# Patient Record
Sex: Female | Born: 1950 | Race: Black or African American | Hispanic: No | Marital: Married | Smoking: Never smoker
Health system: Southern US, Community
[De-identification: ages and names within clinical notes are randomized; demographics above are authoritative.]

## PROBLEM LIST (undated history)

## (undated) DIAGNOSIS — I10 Essential (primary) hypertension: Secondary | ICD-10-CM

## (undated) DIAGNOSIS — M1990 Unspecified osteoarthritis, unspecified site: Secondary | ICD-10-CM

## (undated) DIAGNOSIS — R011 Cardiac murmur, unspecified: Secondary | ICD-10-CM

## (undated) DIAGNOSIS — N289 Disorder of kidney and ureter, unspecified: Secondary | ICD-10-CM

## (undated) DIAGNOSIS — I499 Cardiac arrhythmia, unspecified: Secondary | ICD-10-CM

## (undated) DIAGNOSIS — K769 Liver disease, unspecified: Secondary | ICD-10-CM

## (undated) HISTORY — PX: ABDOMINAL HYSTERECTOMY: SHX81

## (undated) HISTORY — PX: TUBAL LIGATION: SHX77

## (undated) HISTORY — PX: CHOLECYSTECTOMY: SHX55

---

## 2011-01-09 ENCOUNTER — Emergency Department (INDEPENDENT_AMBULATORY_CARE_PROVIDER_SITE_OTHER): Payer: Medicaid Other

## 2011-01-09 ENCOUNTER — Emergency Department (HOSPITAL_BASED_OUTPATIENT_CLINIC_OR_DEPARTMENT_OTHER)
Admission: EM | Admit: 2011-01-09 | Discharge: 2011-01-09 | Disposition: A | Discharge status: Nursing Home (Includes ICF) | Payer: Medicaid Other | Attending: Emergency Medicine | Admitting: Emergency Medicine

## 2011-01-09 DIAGNOSIS — R1013 Epigastric pain: Secondary | ICD-10-CM

## 2011-01-09 DIAGNOSIS — Z79899 Other long term (current) drug therapy: Secondary | ICD-10-CM | POA: Insufficient documentation

## 2011-01-09 DIAGNOSIS — N200 Calculus of kidney: Secondary | ICD-10-CM | POA: Insufficient documentation

## 2011-01-09 DIAGNOSIS — R197 Diarrhea, unspecified: Secondary | ICD-10-CM

## 2011-01-09 DIAGNOSIS — R112 Nausea with vomiting, unspecified: Secondary | ICD-10-CM

## 2011-01-09 DIAGNOSIS — Z8739 Personal history of other diseases of the musculoskeletal system and connective tissue: Secondary | ICD-10-CM | POA: Insufficient documentation

## 2011-01-09 DIAGNOSIS — K7689 Other specified diseases of liver: Secondary | ICD-10-CM | POA: Insufficient documentation

## 2011-01-09 DIAGNOSIS — E278 Other specified disorders of adrenal gland: Secondary | ICD-10-CM | POA: Insufficient documentation

## 2011-01-09 DIAGNOSIS — R079 Chest pain, unspecified: Secondary | ICD-10-CM

## 2011-01-09 DIAGNOSIS — G8929 Other chronic pain: Secondary | ICD-10-CM | POA: Insufficient documentation

## 2011-01-09 DIAGNOSIS — R1115 Cyclical vomiting syndrome unrelated to migraine: Secondary | ICD-10-CM | POA: Insufficient documentation

## 2011-01-09 DIAGNOSIS — R1110 Vomiting, unspecified: Secondary | ICD-10-CM

## 2011-01-09 DIAGNOSIS — I10 Essential (primary) hypertension: Secondary | ICD-10-CM | POA: Insufficient documentation

## 2011-01-09 LAB — DIFFERENTIAL
Eosinophils Relative: 0 % (ref 0–5)
Lymphocytes Relative: 10 % — ABNORMAL LOW (ref 12–46)
Lymphs Abs: 1.2 10*3/uL (ref 0.7–4.0)
Monocytes Absolute: 0.4 10*3/uL (ref 0.1–1.0)
Monocytes Relative: 4 % (ref 3–12)
Neutro Abs: 10.7 10*3/uL — ABNORMAL HIGH (ref 1.7–7.7)

## 2011-01-09 LAB — URINE MICROSCOPIC-ADD ON

## 2011-01-09 LAB — CBC
HCT: 38.5 % (ref 36.0–46.0)
Hemoglobin: 13 g/dL (ref 12.0–15.0)
MCH: 30.6 pg (ref 26.0–34.0)
MCV: 90.6 fL (ref 78.0–100.0)
Platelets: 418 10*3/uL — ABNORMAL HIGH (ref 150–400)
RBC: 4.25 MIL/uL (ref 3.87–5.11)
WBC: 12.4 10*3/uL — ABNORMAL HIGH (ref 4.0–10.5)

## 2011-01-09 LAB — COMPREHENSIVE METABOLIC PANEL
ALT: 12 U/L (ref 0–35)
AST: 26 U/L (ref 0–37)
Albumin: 4.7 g/dL (ref 3.5–5.2)
Calcium: 11.6 mg/dL — ABNORMAL HIGH (ref 8.4–10.5)
Chloride: 103 mEq/L (ref 96–112)
Creatinine, Ser: 1.3 mg/dL — ABNORMAL HIGH (ref 0.4–1.2)
GFR calc Af Amer: 51 mL/min — ABNORMAL LOW (ref >60)
Sodium: 144 mEq/L (ref 135–145)

## 2011-01-09 LAB — URINALYSIS, ROUTINE W REFLEX MICROSCOPIC
Bilirubin Urine: NEGATIVE
Glucose, UA: NEGATIVE mg/dL
Nitrite: NEGATIVE
Specific Gravity, Urine: 1.01 (ref 1.005–1.030)
pH: 7 (ref 5.0–8.0)

## 2011-01-09 MED ORDER — IOHEXOL 300 MG/ML  SOLN
100.0000 mL | Freq: Once | INTRAMUSCULAR | Status: AC | PRN
  Administered 2011-01-09: 100 mL via INTRAVENOUS

## 2013-10-24 ENCOUNTER — Emergency Department (HOSPITAL_BASED_OUTPATIENT_CLINIC_OR_DEPARTMENT_OTHER): Payer: Medicaid Other

## 2013-10-24 ENCOUNTER — Encounter (HOSPITAL_BASED_OUTPATIENT_CLINIC_OR_DEPARTMENT_OTHER): Payer: Self-pay | Admitting: Emergency Medicine

## 2013-10-24 ENCOUNTER — Emergency Department (HOSPITAL_BASED_OUTPATIENT_CLINIC_OR_DEPARTMENT_OTHER)
Admission: EM | Admit: 2013-10-24 | Discharge: 2013-10-25 | Disposition: A | Discharge status: Hospice | Payer: Medicaid Other | Attending: Emergency Medicine | Admitting: Emergency Medicine

## 2013-10-24 DIAGNOSIS — Z9851 Tubal ligation status: Secondary | ICD-10-CM | POA: Insufficient documentation

## 2013-10-24 DIAGNOSIS — R51 Headache: Secondary | ICD-10-CM | POA: Insufficient documentation

## 2013-10-24 DIAGNOSIS — Z9089 Acquired absence of other organs: Secondary | ICD-10-CM | POA: Insufficient documentation

## 2013-10-24 DIAGNOSIS — E669 Obesity, unspecified: Secondary | ICD-10-CM | POA: Insufficient documentation

## 2013-10-24 DIAGNOSIS — R509 Fever, unspecified: Secondary | ICD-10-CM | POA: Insufficient documentation

## 2013-10-24 DIAGNOSIS — Z79899 Other long term (current) drug therapy: Secondary | ICD-10-CM | POA: Insufficient documentation

## 2013-10-24 DIAGNOSIS — Z87448 Personal history of other diseases of urinary system: Secondary | ICD-10-CM | POA: Insufficient documentation

## 2013-10-24 DIAGNOSIS — I10 Essential (primary) hypertension: Secondary | ICD-10-CM | POA: Insufficient documentation

## 2013-10-24 DIAGNOSIS — I160 Hypertensive urgency: Secondary | ICD-10-CM

## 2013-10-24 DIAGNOSIS — Z7982 Long term (current) use of aspirin: Secondary | ICD-10-CM | POA: Insufficient documentation

## 2013-10-24 DIAGNOSIS — Z791 Long term (current) use of non-steroidal anti-inflammatories (NSAID): Secondary | ICD-10-CM | POA: Insufficient documentation

## 2013-10-24 DIAGNOSIS — K56600 Partial intestinal obstruction, unspecified as to cause: Secondary | ICD-10-CM

## 2013-10-24 DIAGNOSIS — R1110 Vomiting, unspecified: Secondary | ICD-10-CM | POA: Insufficient documentation

## 2013-10-24 DIAGNOSIS — K56609 Unspecified intestinal obstruction, unspecified as to partial versus complete obstruction: Secondary | ICD-10-CM | POA: Insufficient documentation

## 2013-10-24 DIAGNOSIS — R0602 Shortness of breath: Secondary | ICD-10-CM | POA: Insufficient documentation

## 2013-10-24 DIAGNOSIS — R197 Diarrhea, unspecified: Secondary | ICD-10-CM | POA: Insufficient documentation

## 2013-10-24 HISTORY — DX: Disorder of kidney and ureter, unspecified: N28.9

## 2013-10-24 HISTORY — DX: Essential (primary) hypertension: I10

## 2013-10-24 LAB — COMPREHENSIVE METABOLIC PANEL
ALBUMIN: 3.8 g/dL (ref 3.5–5.2)
ALK PHOS: 215 U/L — AB (ref 39–117)
ALT: 12 U/L (ref 0–35)
AST: 17 U/L (ref 0–37)
BILIRUBIN TOTAL: 0.6 mg/dL (ref 0.3–1.2)
BUN: 13 mg/dL (ref 6–23)
CHLORIDE: 101 meq/L (ref 96–112)
CO2: 27 meq/L (ref 19–32)
Calcium: 10 mg/dL (ref 8.4–10.5)
Creatinine, Ser: 1.3 mg/dL — ABNORMAL HIGH (ref 0.50–1.10)
GFR calc Af Amer: 51 mL/min — ABNORMAL LOW (ref >90)
GFR, EST NON AFRICAN AMERICAN: 44 mL/min — AB (ref >90)
Glucose, Bld: 131 mg/dL — ABNORMAL HIGH (ref 70–99)
POTASSIUM: 3.4 meq/L — AB (ref 3.7–5.3)
SODIUM: 143 meq/L (ref 137–147)
Total Protein: 7.5 g/dL (ref 6.0–8.3)

## 2013-10-24 LAB — CBC WITH DIFFERENTIAL/PLATELET
BASOS ABS: 0 10*3/uL (ref 0.0–0.1)
BASOS PCT: 0 % (ref 0–1)
Eosinophils Absolute: 0 10*3/uL (ref 0.0–0.7)
Eosinophils Relative: 0 % (ref 0–5)
HEMATOCRIT: 38.8 % (ref 36.0–46.0)
Hemoglobin: 12.8 g/dL (ref 12.0–15.0)
LYMPHS PCT: 16 % (ref 12–46)
Lymphs Abs: 1.5 10*3/uL (ref 0.7–4.0)
MCH: 31.1 pg (ref 26.0–34.0)
MCHC: 33 g/dL (ref 30.0–36.0)
MCV: 94.2 fL (ref 78.0–100.0)
MONO ABS: 0.8 10*3/uL (ref 0.1–1.0)
Monocytes Relative: 9 % (ref 3–12)
NEUTROS ABS: 7.1 10*3/uL (ref 1.7–7.7)
NEUTROS PCT: 75 % (ref 43–77)
Platelets: 453 10*3/uL — ABNORMAL HIGH (ref 150–400)
RBC: 4.12 MIL/uL (ref 3.87–5.11)
RDW: 13.5 % (ref 11.5–15.5)
WBC: 9.4 10*3/uL (ref 4.0–10.5)

## 2013-10-24 LAB — URINALYSIS, ROUTINE W REFLEX MICROSCOPIC
Bilirubin Urine: NEGATIVE
GLUCOSE, UA: NEGATIVE mg/dL
KETONES UR: 15 mg/dL — AB
Leukocytes, UA: NEGATIVE
Nitrite: NEGATIVE
PH: 8 (ref 5.0–8.0)
PROTEIN: 100 mg/dL — AB
Specific Gravity, Urine: 1.013 (ref 1.005–1.030)
Urobilinogen, UA: 0.2 mg/dL (ref 0.0–1.0)

## 2013-10-24 LAB — URINE MICROSCOPIC-ADD ON

## 2013-10-24 LAB — CG4 I-STAT (LACTIC ACID): Lactic Acid, Venous: 1.32 mmol/L (ref 0.5–2.2)

## 2013-10-24 LAB — LIPASE, BLOOD: Lipase: 14 U/L (ref 11–59)

## 2013-10-24 MED ORDER — IOHEXOL 300 MG/ML  SOLN
100.0000 mL | Freq: Once | INTRAMUSCULAR | Status: AC | PRN
  Administered 2013-10-24: 80 mL via INTRAVENOUS

## 2013-10-24 MED ORDER — SODIUM CHLORIDE 0.9 % IV BOLUS (SEPSIS)
1000.0000 mL | Freq: Once | INTRAVENOUS | Status: AC
  Administered 2013-10-24: 1000 mL via INTRAVENOUS

## 2013-10-24 MED ORDER — IOHEXOL 300 MG/ML  SOLN
50.0000 mL | Freq: Once | INTRAMUSCULAR | Status: AC | PRN
  Administered 2013-10-24: 50 mL via ORAL

## 2013-10-24 MED ORDER — ONDANSETRON HCL 4 MG/2ML IJ SOLN
4.0000 mg | Freq: Once | INTRAMUSCULAR | Status: AC
  Administered 2013-10-24: 4 mg via INTRAVENOUS
  Filled 2013-10-24: qty 2

## 2013-10-24 MED ORDER — MORPHINE SULFATE 4 MG/ML IJ SOLN
4.0000 mg | Freq: Once | INTRAMUSCULAR | Status: AC
  Administered 2013-10-24: 4 mg via INTRAVENOUS
  Filled 2013-10-24: qty 1

## 2013-10-24 MED ORDER — HYDRALAZINE HCL 20 MG/ML IJ SOLN
10.0000 mg | Freq: Once | INTRAMUSCULAR | Status: DC
  Filled 2013-10-24: qty 1

## 2013-10-24 MED ORDER — SODIUM CHLORIDE 0.9 % IV SOLN
Freq: Once | INTRAVENOUS | Status: AC
Start: 2013-10-24 — End: 2013-10-24
  Administered 2013-10-24: 22:00:00 via INTRAVENOUS

## 2013-10-24 NOTE — ED Provider Notes (Signed)
CSN: [id number]     Arrival date & time 10/24/13  1825 History  This chart was scribed for Glynn Octave, MD by Leone Payor, ED Scribe. This patient was seen in room MH01/MH01 and the patient's care was started 8:16 PM.    Chief Complaint  Patient presents with  . Abdominal Pain    The history is provided by the patient. No language interpreter was used.    HPI Comments: Heather Vasquez is a 63 y.o. female with past medical history of HTN and renal insufficiency who presents to the Emergency Department complaining of 3 days of constant, gradually worsening abdominal pain with associated episodes of diarrhea and vomiting. Pt states she is unable to tolerate food or liquids. She reports subjective fevers and mild SOB. She denies similar symptoms in the past except for when she had her cholecystectomy. She denies recent travel. She has history of cholecystectomy, cesarean section, and tubal ligation. She denies chest pain, hematochezia, hematemesis.   Past Medical History  Diagnosis Date  . Hypertension   . Renal insufficiency    Past Surgical History  Procedure Laterality Date  . Cholecystectomy    . Cesarean section    . Tubal ligation     History reviewed. No pertinent family history. History  Substance Use Topics  . Smoking status: Never Smoker   . Smokeless tobacco: Not on file  . Alcohol Use: No   OB History   Grav Para Term Preterm Abortions TAB SAB Ect Mult Living                 Review of Systems A complete 10 system review of systems was obtained and all systems are negative except as noted in the HPI and PMH.   Allergies  Review of patient's allergies indicates no known allergies.  Home Medications   Current Outpatient Rx  Name  Route  Sig  Dispense  Refill  . aspirin 81 MG tablet   Oral   Take 81 mg by mouth daily.         . carvedilol (COREG) 25 MG tablet   Oral   Take 25 mg by mouth 2 (two) times daily with a meal.         . diltiazem (CARDIZEM LA)  120 MG 24 hr tablet   Oral   Take 180 mg by mouth daily.         . ferrous sulfate 325 (65 FE) MG EC tablet   Oral   Take 325 mg by mouth 3 (three) times daily with meals.         . furosemide (LASIX) 40 MG tablet   Oral   Take 40 mg by mouth.         . gabapentin (NEURONTIN) 300 MG capsule   Oral   Take 300 mg by mouth 3 (three) times daily.         Marland Kitchen lisinopril (PRINIVIL,ZESTRIL) 40 MG tablet   Oral   Take 40 mg by mouth daily.         . meloxicam (MOBIC) 7.5 MG tablet   Oral   Take 7.5 mg by mouth daily.         Marland Kitchen oxyCODONE-acetaminophen (PERCOCET) 10-325 MG per tablet   Oral   Take 1 tablet by mouth every 4 (four) hours as needed for pain.         . potassium chloride (K-DUR) 10 MEQ tablet   Oral   Take 10 mEq by mouth daily.  BP 193/102  Pulse 84  Temp(Src) 99.3 F (37.4 C) (Oral)  Resp 20  Ht 5\' 2"  (1.575 m)  Wt 250 lb (113.399 kg)  BMI 45.71 kg/m2  SpO2 97% Physical Exam  Nursing note and vitals reviewed. Constitutional: She is oriented to person, place, and time. She appears well-developed and well-nourished.  HENT:  Head: Normocephalic and atraumatic.  Mouth/Throat: Mucous membranes are dry.  Eyes: EOM are normal. Pupils are equal, round, and reactive to light.  Cardiovascular: Normal rate, regular rhythm and normal heart sounds.   Pulmonary/Chest: Effort normal and breath sounds normal. No respiratory distress. She has no wheezes. She has no rales. She exhibits no tenderness.  Abdominal: Soft. She exhibits no distension and no mass. There is tenderness. There is guarding. There is no rebound.  Abdomen is obese. Diffusely tender with guarding. No CVA tenderness.   Neurological: She is alert and oriented to person, place, and time.  Skin: Skin is warm and dry.  Psychiatric: She has a normal mood and affect.    ED Course  Procedures (including critical care time)  DIAGNOSTIC STUDIES: Oxygen Saturation is 100% on RA, normal  by my interpretation.    COORDINATION OF CARE: 8:23 PM Discussed treatment plan with pt at bedside and pt agreed to plan.  10:17 PM Pt and family updated on lab and imaging results. Pt understands she will be transferred and admitted.     Labs Review Labs Reviewed  URINALYSIS, ROUTINE W REFLEX MICROSCOPIC - Abnormal; Notable for the following:    APPearance CLOUDY (*)    Hgb urine dipstick TRACE (*)    Ketones, ur 15 (*)    Protein, ur 100 (*)    All other components within normal limits  URINE MICROSCOPIC-ADD ON - Abnormal; Notable for the following:    Squamous Epithelial / LPF FEW (*)    Bacteria, UA FEW (*)    All other components within normal limits  CBC WITH DIFFERENTIAL - Abnormal; Notable for the following:    Platelets 453 (*)    All other components within normal limits  COMPREHENSIVE METABOLIC PANEL - Abnormal; Notable for the following:    Potassium 3.4 (*)    Glucose, Bld 131 (*)    Creatinine, Ser 1.30 (*)    Alkaline Phosphatase 215 (*)    GFR calc non Af Amer 44 (*)    GFR calc Af Amer 51 (*)    All other components within normal limits  LIPASE, BLOOD  CG4 I-STAT (LACTIC ACID)   Imaging Review Ct Abdomen Pelvis W Contrast  10/24/2013   CLINICAL DATA:  Abdominal pain, nausea and vomiting. History of prior cholecystectomy and hysterectomy.  EXAM: CT ABDOMEN AND PELVIS WITH CONTRAST  TECHNIQUE: Multidetector CT imaging of the abdomen and pelvis was performed using the standard protocol following bolus administration of intravenous contrast.  CONTRAST:  50mL OMNIPAQUE IOHEXOL 300 MG/ML SOLN, 80mL OMNIPAQUE IOHEXOL 300 MG/ML SOLN  COMPARISON:  US ABDOMEN LIMITED SLG ORGAN/ASCITES dated 04/19/2013; CT-ABDOMEN AND PELVIS W/O CONTRAST dated 01/22/2013  FINDINGS: Small bowel dilatation is identified beginning at the level of the proximal to mid jejunum and extending into distal small bowel loops. Maximal small bowel caliber is approximately 4.0 cm. Transition point is  identified in the anterior midline upper pelvis at the level of a visible small bowel anastomosis. Findings are consistent with partial small bowel obstruction with some air and fluid present in the ascending and transverse colon. There is no evidence of bowel perforation.  No ascites or abscess is identified. The liver is unremarkable. Bile ducts are mildly prominent post cholecystectomy with the common bile duct measuring up to 12 mm in diameter.  The spleen, pancreas and adrenal glands are unremarkable. Nonobstructing calculi are identified in the lower poles of both kidneys.  The bladder is unremarkable. No hernias are identified. The uterus is been removed. Bony structures are unremarkable.  IMPRESSION: Evidence of partial small bowel obstruction. Transition point in the anterior upper pelvis is at a visible small bowel anastomosis.   Electronically Signed   By: Irish Lack M.D.   On: 10/24/2013 21:58    EKG Interpretation   None       MDM   1. Partial small bowel obstruction   2. Hypertensive urgency    3 day history of diffuse abdominal pain with vomiting, diarrhea and subjective fever. No chest pain or shortness of breath.  Abdomen diffusely tender without peritoneal signs.  Urinalysis negative. Labs unremarkable. Blood pressure remains uncontrolled. IV hydralazine given.  Patient is not sure whether she took her meds today.  CT scan shows partial small bowel obstruction. BP has improved to 190/100.  D/w Dr. Selena Batten at [facility] who requests CT head given hypertension and vomiting. Also d/w surgery Dr. Claudine Mouton who agrees with medical admission.  I personally performed the services described in this documentation, which was scribed in my presence. The recorded information has been reviewed and is accurate.   Glynn Octave, MD 10/24/13 2350

## 2013-10-24 NOTE — ED Notes (Signed)
EDP Rancour notified of current BP-orders to hold hydralazine IV

## 2013-10-24 NOTE — ED Notes (Signed)
Patient transported to CT 

## 2013-10-24 NOTE — ED Notes (Signed)
Pt c/o abd pain fever and vomiting x 3 days

## 2014-12-18 ENCOUNTER — Encounter (HOSPITAL_BASED_OUTPATIENT_CLINIC_OR_DEPARTMENT_OTHER): Payer: Self-pay

## 2014-12-18 ENCOUNTER — Emergency Department (HOSPITAL_BASED_OUTPATIENT_CLINIC_OR_DEPARTMENT_OTHER)
Admission: EM | Admit: 2014-12-18 | Discharge: 2014-12-18 | Disposition: A | Discharge status: Home / Self Care | Payer: No Typology Code available for payment source | Attending: Emergency Medicine | Admitting: Emergency Medicine

## 2014-12-18 DIAGNOSIS — I10 Essential (primary) hypertension: Secondary | ICD-10-CM | POA: Insufficient documentation

## 2014-12-18 DIAGNOSIS — M1990 Unspecified osteoarthritis, unspecified site: Secondary | ICD-10-CM | POA: Insufficient documentation

## 2014-12-18 DIAGNOSIS — R112 Nausea with vomiting, unspecified: Secondary | ICD-10-CM | POA: Insufficient documentation

## 2014-12-18 DIAGNOSIS — N289 Disorder of kidney and ureter, unspecified: Secondary | ICD-10-CM | POA: Insufficient documentation

## 2014-12-18 DIAGNOSIS — R011 Cardiac murmur, unspecified: Secondary | ICD-10-CM | POA: Insufficient documentation

## 2014-12-18 DIAGNOSIS — D649 Anemia, unspecified: Secondary | ICD-10-CM | POA: Diagnosis not present

## 2014-12-18 DIAGNOSIS — E876 Hypokalemia: Secondary | ICD-10-CM

## 2014-12-18 HISTORY — DX: Cardiac murmur, unspecified: R01.1

## 2014-12-18 HISTORY — DX: Unspecified osteoarthritis, unspecified site: M19.90

## 2014-12-18 LAB — URINALYSIS, ROUTINE W REFLEX MICROSCOPIC
Glucose, UA: NEGATIVE mg/dL
Hgb urine dipstick: NEGATIVE
Ketones, ur: NEGATIVE mg/dL
Leukocytes, UA: NEGATIVE
Nitrite: NEGATIVE
PROTEIN: NEGATIVE mg/dL
Specific Gravity, Urine: 1.016 (ref 1.005–1.030)
Urobilinogen, UA: 1 mg/dL (ref 0.0–1.0)
pH: 5.5 (ref 5.0–8.0)

## 2014-12-18 LAB — CBC WITH DIFFERENTIAL/PLATELET
Basophils Absolute: 0 10*3/uL (ref 0.0–0.1)
Basophils Relative: 0 % (ref 0–1)
EOS ABS: 0.1 10*3/uL (ref 0.0–0.7)
Eosinophils Relative: 1 % (ref 0–5)
HCT: 26.4 % — ABNORMAL LOW (ref 36.0–46.0)
Hemoglobin: 9.1 g/dL — ABNORMAL LOW (ref 12.0–15.0)
Lymphocytes Relative: 23 % (ref 12–46)
Lymphs Abs: 1.8 10*3/uL (ref 0.7–4.0)
MCH: 32.2 pg (ref 26.0–34.0)
MCHC: 34.5 g/dL (ref 30.0–36.0)
MCV: 93.3 fL (ref 78.0–100.0)
Monocytes Absolute: 0.8 10*3/uL (ref 0.1–1.0)
Monocytes Relative: 10 % (ref 3–12)
NEUTROS PCT: 66 % (ref 43–77)
Neutro Abs: 5.2 10*3/uL (ref 1.7–7.7)
PLATELETS: 354 10*3/uL (ref 150–400)
RBC: 2.83 MIL/uL — AB (ref 3.87–5.11)
RDW: 15.3 % (ref 11.5–15.5)
WBC: 7.9 10*3/uL (ref 4.0–10.5)

## 2014-12-18 LAB — BASIC METABOLIC PANEL
Anion gap: 10 (ref 5–15)
BUN: 17 mg/dL (ref 6–23)
CHLORIDE: 103 mmol/L (ref 96–112)
CO2: 29 mmol/L (ref 19–32)
Calcium: 7.5 mg/dL — ABNORMAL LOW (ref 8.4–10.5)
Creatinine, Ser: 1.98 mg/dL — ABNORMAL HIGH (ref 0.50–1.10)
GFR, EST AFRICAN AMERICAN: 30 mL/min — AB (ref >90)
GFR, EST NON AFRICAN AMERICAN: 26 mL/min — AB (ref >90)
GLUCOSE: 102 mg/dL — AB (ref 70–99)
POTASSIUM: 2.9 mmol/L — AB (ref 3.5–5.1)
SODIUM: 142 mmol/L (ref 135–145)

## 2014-12-18 MED ORDER — ONDANSETRON 4 MG PO TBDP: 4.0000 mg | ORAL_TABLET | Freq: Three times a day (TID) | ORAL | Status: AC | PRN

## 2014-12-18 MED ORDER — POTASSIUM CHLORIDE CRYS ER 20 MEQ PO TBCR
40.0000 meq | EXTENDED_RELEASE_TABLET | Freq: Once | ORAL | Status: AC
  Administered 2014-12-18: 40 meq via ORAL
  Filled 2014-12-18: qty 2

## 2014-12-18 MED ORDER — MORPHINE SULFATE 4 MG/ML IJ SOLN
4.0000 mg | Freq: Once | INTRAMUSCULAR | Status: AC
  Administered 2014-12-18: 4 mg via INTRAVENOUS
  Filled 2014-12-18: qty 1

## 2014-12-18 MED ORDER — ONDANSETRON HCL 4 MG/2ML IJ SOLN
4.0000 mg | Freq: Once | INTRAMUSCULAR | Status: AC
  Administered 2014-12-18: 4 mg via INTRAVENOUS
  Filled 2014-12-18: qty 2

## 2014-12-18 MED ORDER — POTASSIUM CHLORIDE 10 MEQ/100ML IV SOLN
10.0000 meq | Freq: Once | INTRAVENOUS | Status: AC
  Administered 2014-12-18: 10 meq via INTRAVENOUS
  Filled 2014-12-18: qty 100

## 2014-12-18 MED ORDER — SODIUM CHLORIDE 0.9 % IV BOLUS (SEPSIS)
1000.0000 mL | Freq: Once | INTRAVENOUS | Status: AC
  Administered 2014-12-18: 1000 mL via INTRAVENOUS

## 2014-12-18 NOTE — ED Provider Notes (Signed)
CSN: [id number]     Arrival date & time 12/18/14  1353 History   First MD Initiated Contact with Patient 12/18/14 1408     Chief Complaint  Patient presents with  . Emesis     (Consider location/radiation/quality/duration/timing/severity/associated sxs/prior Treatment) HPI Comments: Pt comes in with c/o dehydration. Pt states that she has had issue with vomiting and diarrhea. Pt states that she was see by gi and told that she had a bacterial overgrowth in her stomach and she was on antibiotic for a months. Pt states that she stopped them in the last week and she states that she has been vomiting for 6 days. She states that she called gi and they told her to come in for hydration. Denies fever and she is not having any more abdominal pain then she has at baseline  The history is provided by the patient. No language interpreter was used.    Past Medical History  Diagnosis Date  . Hypertension   . Renal insufficiency   . Arthritis   . Heart murmur    Past Surgical History  Procedure Laterality Date  . Cholecystectomy    . Cesarean section    . Tubal ligation    . Abdominal hysterectomy     No family history on file. History  Substance Use Topics  . Smoking status: Never Smoker   . Smokeless tobacco: Not on file  . Alcohol Use: No   OB History    No data available     Review of Systems  All other systems reviewed and are negative.     Allergies  Review of patient's allergies indicates no known allergies.  Home Medications   Prior to Admission medications   Medication Sig Start Date End Date Taking? Authorizing Provider  Cholecalciferol (VITAMIN D PO) Take by mouth.   Yes Historical Provider, MD  Escitalopram Oxalate (LEXAPRO PO) Take by mouth.   Yes Historical Provider, MD  oxyCODONE-acetaminophen (PERCOCET) 10-325 MG per tablet Take 1 tablet by mouth every 4 (four) hours as needed for pain.    Historical Provider, MD   BP 103/73 mmHg  Pulse 74  Temp(Src) 98.7  F (37.1 C) (Oral)  Resp 16  SpO2 100% Physical Exam  Constitutional: She is oriented to person, place, and time. She appears well-developed and well-nourished.  HENT:  Head: Normocephalic and atraumatic.  Mouth/Throat: Mucous membranes are dry.  Cardiovascular: Normal rate and regular rhythm.   Pulmonary/Chest: Effort normal and breath sounds normal.  Abdominal: Soft. Bowel sounds are normal. There is no tenderness.  Musculoskeletal: Normal range of motion.  Neurological: She is alert and oriented to person, place, and time.  Skin: Skin is warm and dry.  Nursing note and vitals reviewed.   ED Course  Procedures (including critical care time) Labs Review Labs Reviewed  BASIC METABOLIC PANEL - Abnormal; Notable for the following:    Potassium 2.9 (*)    Glucose, Bld 102 (*)    Creatinine, Ser 1.98 (*)    Calcium 7.5 (*)    GFR calc non Af Amer 26 (*)    GFR calc Af Amer 30 (*)    All other components within normal limits  URINALYSIS, ROUTINE W REFLEX MICROSCOPIC - Abnormal; Notable for the following:    Bilirubin Urine SMALL (*)    All other components within normal limits  CBC WITH DIFFERENTIAL/PLATELET - Abnormal; Notable for the following:    RBC 2.83 (*)    Hemoglobin 9.1 (*)  HCT 26.4 (*)    All other components within normal limits    Imaging Review No results found.   EKG Interpretation None      MDM   Final diagnoses:  Hypokalemia  Nausea and vomiting, vomiting of unspecified type  Renal disease  Chronic anemia    Noted care everywhere chart and pt labs consistent with her baseline. Pt given potassium here. Pt was on rifaximin from gi last month:pt is tolerating po and is feeling better at this time. Pt do follow up with her gi at [facility] this week. No findings with acute abdomen on exam    Teressa LowerVrinda Meagan Ancona, NP 12/18/14 1646  Blane OharaJoshua Zavitz, MD 12/20/14 [phone]

## 2014-12-18 NOTE — ED Notes (Signed)
Pt drank PO fluids with no difficulty.

## 2014-12-18 NOTE — Discharge Instructions (Signed)
Follow up with your doctor as needed for continued symptoms Hypokalemia Hypokalemia means that the amount of potassium in the blood is lower than normal.Potassium is a chemical, called an electrolyte, that helps regulate the amount of fluid in the body. It also stimulates muscle contraction and helps nerves function properly.Most of the body's potassium is inside of cells, and only a very small amount is in the blood. Because the amount in the blood is so small, minor changes can be life-threatening. CAUSES  Antibiotics.  Diarrhea or vomiting.  Using laxatives too much, which can cause diarrhea.  Chronic kidney disease.  Water pills (diuretics).  Eating disorders (bulimia).  Low magnesium level.  Sweating a lot. SIGNS AND SYMPTOMS  Weakness.  Constipation.  Fatigue.  Muscle cramps.  Mental confusion.  Skipped heartbeats or irregular heartbeat (palpitations).  Tingling or numbness. DIAGNOSIS  Your health care provider can diagnose hypokalemia with blood tests. In addition to checking your potassium level, your health care provider may also check other lab tests. TREATMENT Hypokalemia can be treated with potassium supplements taken by mouth or adjustments in your current medicines. If your potassium level is very low, you may need to get potassium through a vein (IV) and be monitored in the hospital. A diet high in potassium is also helpful. Foods high in potassium are:  Nuts, such as peanuts and pistachios.  Seeds, such as sunflower seeds and pumpkin seeds.  Peas, lentils, and lima beans.  Whole grain and bran cereals and breads.  Fresh fruit and vegetables, such as apricots, avocado, bananas, cantaloupe, kiwi, oranges, tomatoes, asparagus, and potatoes.  Orange and tomato juices.  Red meats.  Fruit yogurt. HOME CARE INSTRUCTIONS  Take all medicines as prescribed by your health care provider.  Maintain a healthy diet by including nutritious food, such as  fruits, vegetables, nuts, whole grains, and lean meats.  If you are taking a laxative, be sure to follow the directions on the label. SEEK MEDICAL CARE IF:  Your weakness gets worse.  You feel your heart pounding or racing.  You are vomiting or having diarrhea.  You are diabetic and having trouble keeping your blood glucose in the normal range. SEEK IMMEDIATE MEDICAL CARE IF:  You have chest pain, shortness of breath, or dizziness.  You are vomiting or having diarrhea for more than 2 days.  You faint. MAKE SURE YOU:   Understand these instructions.  Will watch your condition.  Will get help right away if you are not doing well or get worse. Document Released: 09/15/2005 Document Revised: 07/06/2013 Document Reviewed: 03/18/2013 [facility] Patient Information 2015 [employer], [place]. This information is not intended to replace advice given to you by your health care provider. Make sure you discuss any questions you have with your health care provider.

## 2014-12-18 NOTE — ED Notes (Signed)
NP at bedside.

## 2014-12-18 NOTE — ED Notes (Signed)
Daughter states pt was treated for "bacterial overgrowth in her stomach with abx"-completed abx end of Feb-pt with vomiting x 6 days

## 2015-03-17 ENCOUNTER — Emergency Department (HOSPITAL_BASED_OUTPATIENT_CLINIC_OR_DEPARTMENT_OTHER): Payer: No Typology Code available for payment source

## 2015-03-17 ENCOUNTER — Encounter (HOSPITAL_BASED_OUTPATIENT_CLINIC_OR_DEPARTMENT_OTHER): Payer: Self-pay | Admitting: *Deleted

## 2015-03-17 ENCOUNTER — Emergency Department (HOSPITAL_BASED_OUTPATIENT_CLINIC_OR_DEPARTMENT_OTHER)
Admission: EM | Admit: 2015-03-17 | Discharge: 2015-03-17 | Disposition: A | Discharge status: Other Acute Inpatient Hospital | Payer: No Typology Code available for payment source | Attending: Emergency Medicine | Admitting: Emergency Medicine

## 2015-03-17 DIAGNOSIS — W01198A Fall on same level from slipping, tripping and stumbling with subsequent striking against other object, initial encounter: Secondary | ICD-10-CM | POA: Insufficient documentation

## 2015-03-17 DIAGNOSIS — Y9289 Other specified places as the place of occurrence of the external cause: Secondary | ICD-10-CM | POA: Insufficient documentation

## 2015-03-17 DIAGNOSIS — Y9389 Activity, other specified: Secondary | ICD-10-CM | POA: Diagnosis not present

## 2015-03-17 DIAGNOSIS — K5669 Other intestinal obstruction: Secondary | ICD-10-CM | POA: Diagnosis not present

## 2015-03-17 DIAGNOSIS — D649 Anemia, unspecified: Secondary | ICD-10-CM

## 2015-03-17 DIAGNOSIS — W19XXXA Unspecified fall, initial encounter: Secondary | ICD-10-CM

## 2015-03-17 DIAGNOSIS — K56609 Unspecified intestinal obstruction, unspecified as to partial versus complete obstruction: Secondary | ICD-10-CM

## 2015-03-17 DIAGNOSIS — S0031XA Abrasion of nose, initial encounter: Secondary | ICD-10-CM | POA: Insufficient documentation

## 2015-03-17 DIAGNOSIS — Z792 Long term (current) use of antibiotics: Secondary | ICD-10-CM | POA: Diagnosis not present

## 2015-03-17 DIAGNOSIS — R609 Edema, unspecified: Secondary | ICD-10-CM

## 2015-03-17 DIAGNOSIS — R011 Cardiac murmur, unspecified: Secondary | ICD-10-CM | POA: Diagnosis not present

## 2015-03-17 DIAGNOSIS — I10 Essential (primary) hypertension: Secondary | ICD-10-CM | POA: Diagnosis not present

## 2015-03-17 DIAGNOSIS — W108XXA Fall (on) (from) other stairs and steps, initial encounter: Secondary | ICD-10-CM | POA: Insufficient documentation

## 2015-03-17 DIAGNOSIS — Y998 Other external cause status: Secondary | ICD-10-CM | POA: Diagnosis not present

## 2015-03-17 DIAGNOSIS — R4182 Altered mental status, unspecified: Secondary | ICD-10-CM

## 2015-03-17 DIAGNOSIS — M1990 Unspecified osteoarthritis, unspecified site: Secondary | ICD-10-CM | POA: Diagnosis not present

## 2015-03-17 DIAGNOSIS — R109 Unspecified abdominal pain: Secondary | ICD-10-CM

## 2015-03-17 DIAGNOSIS — N289 Disorder of kidney and ureter, unspecified: Secondary | ICD-10-CM

## 2015-03-17 HISTORY — DX: Cardiac arrhythmia, unspecified: I49.9

## 2015-03-17 LAB — URINE MICROSCOPIC-ADD ON

## 2015-03-17 LAB — URINALYSIS, ROUTINE W REFLEX MICROSCOPIC
Glucose, UA: NEGATIVE mg/dL
Ketones, ur: NEGATIVE mg/dL
LEUKOCYTES UA: NEGATIVE
NITRITE: NEGATIVE
PROTEIN: NEGATIVE mg/dL
SPECIFIC GRAVITY, URINE: 1.014 (ref 1.005–1.030)
UROBILINOGEN UA: 0.2 mg/dL (ref 0.0–1.0)
pH: 5.5 (ref 5.0–8.0)

## 2015-03-17 LAB — CBC WITH DIFFERENTIAL/PLATELET
BASOS PCT: 0 % (ref 0–1)
Basophils Absolute: 0 10*3/uL (ref 0.0–0.1)
Eosinophils Absolute: 0 10*3/uL (ref 0.0–0.7)
Eosinophils Relative: 0 % (ref 0–5)
HEMATOCRIT: 19.3 % — AB (ref 36.0–46.0)
Hemoglobin: 6.8 g/dL — CL (ref 12.0–15.0)
LYMPHS PCT: 18 % (ref 12–46)
Lymphs Abs: 1.4 10*3/uL (ref 0.7–4.0)
MCH: 34.7 pg — ABNORMAL HIGH (ref 26.0–34.0)
MCHC: 35.2 g/dL (ref 30.0–36.0)
MCV: 98.5 fL (ref 78.0–100.0)
MONO ABS: 0.8 10*3/uL (ref 0.1–1.0)
MONOS PCT: 10 % (ref 3–12)
Neutro Abs: 5.3 10*3/uL (ref 1.7–7.7)
Neutrophils Relative %: 71 % (ref 43–77)
PLATELETS: 354 10*3/uL (ref 150–400)
RBC: 1.96 MIL/uL — AB (ref 3.87–5.11)
RDW: 15.9 % — ABNORMAL HIGH (ref 11.5–15.5)
WBC: 7.4 10*3/uL (ref 4.0–10.5)

## 2015-03-17 LAB — I-STAT CG4 LACTIC ACID, ED: Lactic Acid, Venous: 2.26 mmol/L (ref 0.5–2.0)

## 2015-03-17 LAB — COMPREHENSIVE METABOLIC PANEL
ALK PHOS: 93 U/L (ref 38–126)
ALT: 33 U/L (ref 14–54)
AST: 37 U/L (ref 15–41)
Albumin: 2.2 g/dL — ABNORMAL LOW (ref 3.5–5.0)
Anion gap: 10 (ref 5–15)
BUN: 32 mg/dL — ABNORMAL HIGH (ref 6–20)
CHLORIDE: 114 mmol/L — AB (ref 101–111)
CO2: 18 mmol/L — AB (ref 22–32)
Calcium: 8.3 mg/dL — ABNORMAL LOW (ref 8.9–10.3)
Creatinine, Ser: 1.91 mg/dL — ABNORMAL HIGH (ref 0.44–1.00)
GFR calc Af Amer: 32 mL/min — ABNORMAL LOW (ref >60)
GFR, EST NON AFRICAN AMERICAN: 27 mL/min — AB (ref >60)
GLUCOSE: 91 mg/dL (ref 65–99)
Potassium: 3.1 mmol/L — ABNORMAL LOW (ref 3.5–5.1)
Sodium: 142 mmol/L (ref 135–145)
Total Bilirubin: 1 mg/dL (ref 0.3–1.2)
Total Protein: 4.6 g/dL — ABNORMAL LOW (ref 6.5–8.1)

## 2015-03-17 LAB — CBG MONITORING, ED: Glucose-Capillary: 88 mg/dL (ref 65–99)

## 2015-03-17 LAB — TROPONIN I

## 2015-03-17 MED ORDER — POTASSIUM CHLORIDE 10 MEQ/100ML IV SOLN
10.0000 meq | Freq: Once | INTRAVENOUS | Status: AC
  Administered 2015-03-17: 10 meq via INTRAVENOUS
  Filled 2015-03-17: qty 100

## 2015-03-17 MED ORDER — SODIUM CHLORIDE 0.9 % IV BOLUS (SEPSIS)
500.0000 mL | Freq: Once | INTRAVENOUS | Status: AC
  Administered 2015-03-17: 500 mL via INTRAVENOUS

## 2015-03-17 NOTE — ED Provider Notes (Addendum)
Medical screening examination/treatment/procedure(s) were conducted as a shared visit with non-physician practitioner(s) and myself.  I personally evaluated the patient during the encounter.   EKG Interpretation   Date/Time:  Saturday March 17 2015 12:14:58 EDT Ventricular Rate:  110 PR Interval:  166 QRS Duration: 70 QT Interval:  312 QTC Calculation: 422 R Axis:   -11 Text Interpretation:  Sinus tachycardia Low voltage QRS Borderline ECG  Confirmed by Indiyah Paone  MD, Christmas Faraci [phone]) on 03/17/2015 12:24:21 PM      Results for orders placed or performed during the hospital encounter of 03/17/15  Comprehensive metabolic panel  Result Value Ref Range   Sodium 142 135 - 145 mmol/L   Potassium 3.1 (L) 3.5 - 5.1 mmol/L   Chloride 114 (H) 101 - 111 mmol/L   CO2 18 (L) 22 - 32 mmol/L   Glucose, Bld 91 65 - 99 mg/dL   BUN 32 (H) 6 - 20 mg/dL   Creatinine, Ser 7.84 (H) 0.44 - 1.00 mg/dL   Calcium 8.3 (L) 8.9 - 10.3 mg/dL   Total Protein 4.6 (L) 6.5 - 8.1 g/dL   Albumin 2.2 (L) 3.5 - 5.0 g/dL   AST 37 15 - 41 U/L   ALT 33 14 - 54 U/L   Alkaline Phosphatase 93 38 - 126 U/L   Total Bilirubin 1.0 0.3 - 1.2 mg/dL   GFR calc non Af Amer 27 (L) >60 mL/min   GFR calc Af Amer 32 (L) >60 mL/min   Anion gap 10 5 - 15  Troponin I  Result Value Ref Range   Troponin I <0.03 <0.031 ng/mL  Urinalysis, Routine w reflex microscopic (not at [facility])  Result Value Ref Range   Color, Urine YELLOW YELLOW   APPearance CLEAR CLEAR   Specific Gravity, Urine 1.014 1.005 - 1.030   pH 5.5 5.0 - 8.0   Glucose, UA NEGATIVE NEGATIVE mg/dL   Hgb urine dipstick MODERATE (A) NEGATIVE   Bilirubin Urine SMALL (A) NEGATIVE   Ketones, ur NEGATIVE NEGATIVE mg/dL   Protein, ur NEGATIVE NEGATIVE mg/dL   Urobilinogen, UA 0.2 0.0 - 1.0 mg/dL   Nitrite NEGATIVE NEGATIVE   Leukocytes, UA NEGATIVE NEGATIVE  CBC with Differential  Result Value Ref Range   WBC 7.4 4.0 - 10.5 K/uL   RBC 1.96 (L) 3.87 - 5.11 MIL/uL    Hemoglobin 6.8 (LL) 12.0 - 15.0 g/dL   HCT 69.6 (L) 29.5 - 28.4 %   MCV 98.5 78.0 - 100.0 fL   MCH 34.7 (H) 26.0 - 34.0 pg   MCHC 35.2 30.0 - 36.0 g/dL   RDW 13.2 (H) 44.0 - 10.2 %   Platelets 354 150 - 400 K/uL   Neutrophils Relative % 71 43 - 77 %   Neutro Abs 5.3 1.7 - 7.7 K/uL   Lymphocytes Relative 18 12 - 46 %   Lymphs Abs 1.4 0.7 - 4.0 K/uL   Monocytes Relative 10 3 - 12 %   Monocytes Absolute 0.8 0.1 - 1.0 K/uL   Eosinophils Relative 0 0 - 5 %   Eosinophils Absolute 0.0 0.0 - 0.7 K/uL   Basophils Relative 0 0 - 1 %   Basophils Absolute 0.0 0.0 - 0.1 K/uL  Urine microscopic-add on  Result Value Ref Range   Squamous Epithelial / LPF RARE RARE   WBC, UA 0-2 <3 WBC/hpf   RBC / HPF 7-10 <3 RBC/hpf   Bacteria, UA RARE RARE   Casts HYALINE CASTS (A) NEGATIVE   Crystals  CA OXALATE CRYSTALS (A) NEGATIVE  CBG monitoring, ED  Result Value Ref Range   Glucose-Capillary 88 65 - 99 mg/dL  I-Stat CG4 Lactic Acid, ED  Result Value Ref Range   Lactic Acid, Venous 2.26 (HH) 0.5 - 2.0 mmol/L   Comment NOTIFIED PHYSICIAN    No results found.  Patient seen by me. Patient brought in by family members that she lives with. For an altered mental status. There was a fall on Sunday. On Thursday patient was noted to become somewhat confused and not as alert as usual. Patient at times here will verbally respond although not always appropriate. No slurred speech. There is some complaint of pain a little difficult to sort out at one point it sound like the pain was predominantly in the abdomen now with daughters are salmon think it's her back or her legs that are hurting. Patient will require broad workup. Lab results so far show a mildly elevated lactic acid. Blood cultures have been done. There is a anemia with a hemoglobin below 7. Urinalysis without evidence of urinary tract infection. Some mild hypokalemia and some mild renal insufficiency. No significant liver function test abnormalities. Patient  will be getting a CT of the head and a CT of the abdomen those results are still pending as well as a chest x-ray. Patient will most likely require admission.  Vanetta Mulders, MD 03/17/15 1325  Results for orders placed or performed during the hospital encounter of 03/17/15  Comprehensive metabolic panel  Result Value Ref Range   Sodium 142 135 - 145 mmol/L   Potassium 3.1 (L) 3.5 - 5.1 mmol/L   Chloride 114 (H) 101 - 111 mmol/L   CO2 18 (L) 22 - 32 mmol/L   Glucose, Bld 91 65 - 99 mg/dL   BUN 32 (H) 6 - 20 mg/dL   Creatinine, Ser 1.61 (H) 0.44 - 1.00 mg/dL   Calcium 8.3 (L) 8.9 - 10.3 mg/dL   Total Protein 4.6 (L) 6.5 - 8.1 g/dL   Albumin 2.2 (L) 3.5 - 5.0 g/dL   AST 37 15 - 41 U/L   ALT 33 14 - 54 U/L   Alkaline Phosphatase 93 38 - 126 U/L   Total Bilirubin 1.0 0.3 - 1.2 mg/dL   GFR calc non Af Amer 27 (L) >60 mL/min   GFR calc Af Amer 32 (L) >60 mL/min   Anion gap 10 5 - 15  Troponin I  Result Value Ref Range   Troponin I <0.03 <0.031 ng/mL  Urinalysis, Routine w reflex microscopic (not at [facility])  Result Value Ref Range   Color, Urine YELLOW YELLOW   APPearance CLEAR CLEAR   Specific Gravity, Urine 1.014 1.005 - 1.030   pH 5.5 5.0 - 8.0   Glucose, UA NEGATIVE NEGATIVE mg/dL   Hgb urine dipstick MODERATE (A) NEGATIVE   Bilirubin Urine SMALL (A) NEGATIVE   Ketones, ur NEGATIVE NEGATIVE mg/dL   Protein, ur NEGATIVE NEGATIVE mg/dL   Urobilinogen, UA 0.2 0.0 - 1.0 mg/dL   Nitrite NEGATIVE NEGATIVE   Leukocytes, UA NEGATIVE NEGATIVE  CBC with Differential  Result Value Ref Range   WBC 7.4 4.0 - 10.5 K/uL   RBC 1.96 (L) 3.87 - 5.11 MIL/uL   Hemoglobin 6.8 (LL) 12.0 - 15.0 g/dL   HCT 09.6 (L) 04.5 - 40.9 %   MCV 98.5 78.0 - 100.0 fL   MCH 34.7 (H) 26.0 - 34.0 pg   MCHC 35.2 30.0 - 36.0 g/dL   RDW 81.1 (  H) 11.5 - 15.5 %   Platelets 354 150 - 400 K/uL   Neutrophils Relative % 71 43 - 77 %   Neutro Abs 5.3 1.7 - 7.7 K/uL   Lymphocytes Relative 18 12 - 46 %   Lymphs Abs  1.4 0.7 - 4.0 K/uL   Monocytes Relative 10 3 - 12 %   Monocytes Absolute 0.8 0.1 - 1.0 K/uL   Eosinophils Relative 0 0 - 5 %   Eosinophils Absolute 0.0 0.0 - 0.7 K/uL   Basophils Relative 0 0 - 1 %   Basophils Absolute 0.0 0.0 - 0.1 K/uL  Urine microscopic-add on  Result Value Ref Range   Squamous Epithelial / LPF RARE RARE   WBC, UA 0-2 <3 WBC/hpf   RBC / HPF 7-10 <3 RBC/hpf   Bacteria, UA RARE RARE   Casts HYALINE CASTS (A) NEGATIVE   Crystals CA OXALATE CRYSTALS (A) NEGATIVE  CBG monitoring, ED  Result Value Ref Range   Glucose-Capillary 88 65 - 99 mg/dL  I-Stat CG4 Lactic Acid, ED  Result Value Ref Range   Lactic Acid, Venous 2.26 (HH) 0.5 - 2.0 mmol/L   Comment NOTIFIED PHYSICIAN    Ct Abdomen Pelvis Wo Contrast  03/17/2015   CLINICAL DATA:  Generalized abdominal pain with nausea and diarrhea  EXAM: CT ABDOMEN AND PELVIS WITHOUT CONTRAST  TECHNIQUE: Multidetector CT imaging of the abdomen and pelvis was performed following the standard protocol without oral or intravenous contrast maternal administration.  COMPARISON:  December 15, 2013  FINDINGS: Lung bases are clear. Heart is prominent. There are foci of coronary artery calcification.  Liver is prominent, measuring 17.8 cm in length. There is diffuse decreased liver attenuation consistent with hepatic steatosis. No focal liver lesions are identified. Gallbladder is absent. There is no appreciable biliary duct dilatation.  Spleen, pancreas, and adrenals appear normal.  There is a 1.4 x 0.9 cm calculus in the upper pole of the right kidney, nonobstructing. There is no right renal mass or hydronephrosis. There is a 3 mm nonobstructing calculus in the upper pole the left kidney. There is no left renal mass or hydronephrosis. There is no ureteral calculus on either side.  In the pelvis, the urinary bladder is decompressed with a catheter. Air within the urinary bladder is felt to be iatrogenic. There is no pelvic mass or pelvic fluid  collection. Uterus is absent.  There is generalized anasarca with edema throughout the abdominal and pelvic walls.  There is generalized small bowel dilatation with a transition zone at the level of the terminal ileum. Colon is not distended. There is no free air or portal venous air.  There is no demonstrable ascites, adenopathy, or abscess in the abdomen or pelvis. There is atherosclerotic change in aorta but no demonstrable aneurysm. There is degenerative change in the lumbar spine. There are no blastic or lytic bone lesions.  IMPRESSION: Small bowel obstruction with transition zone at the level of the terminal ileum. No free air.  Diffuse anasarca.  Prominent liver with hepatic steatosis.  Nonobstructing calculi in each kidney. No hydronephrosis on either side.  No abscess.  Uterus and gallbladder absent.  These results were called by telephone at the time of interpretation on 03/17/2015 at 2:18 pm to [facility], who verbally acknowledged these results.   Electronically Signed   By: Bretta Bang III M.D.   On: 03/17/2015 14:18   Dg Chest 2 View  03/17/2015   CLINICAL DATA:  Altered mental status feet.  Recent fall.  EXAM: CHEST  2 VIEW  COMPARISON:  07/04/2013 report, images not available.  FINDINGS: Lungs are clear. Heart and mediastinum are within normal limits. The trachea is midline. No large pleural effusions. Degenerative changes in the thoracic spine.  IMPRESSION: No active cardiopulmonary disease.   Electronically Signed   By: Richarda Overlie M.D.   On: 03/17/2015 14:15   Ct Head Wo Contrast  03/17/2015   CLINICAL DATA:  Altered mental status status post fall.  EXAM: CT HEAD WITHOUT CONTRAST  TECHNIQUE: Contiguous axial images were obtained from the base of the skull through the vertex without intravenous contrast.  COMPARISON:  CT scan of October 24, 2013.  FINDINGS: Bony calvarium appears intact. Mild chronic ischemic white matter disease is noted. No mass effect or midline shift is  noted. Ventricular size is within normal limits. There is no evidence of mass lesion, hemorrhage or acute infarction.  IMPRESSION: Mild chronic ischemic white matter disease. No acute intracranial abnormality seen.   Electronically Signed   By: Lupita Raider, M.D.   On: 03/17/2015 14:09    CT of head without significant findings chest x-ray negative for pneumonia. CT scan of the abdomen despite the fact that there has not been any history of vomiting show small spell obstruction with a transition zone at the level of the terminal ileum. Patient is normally followed the at [facility] Primary care provider [facility]. Will contact general surgery at [facility] first suspect this will also require a medical admission with surgery consult.   Vanetta Mulders, MD 03/17/15 1422

## 2015-03-17 NOTE — ED Notes (Signed)
Teressa Lower, PA-C in room with patient now.  Pt to be admitted to [facility], pending bed availability.  Family at bedside in agreement with patient plan of care thus far.

## 2015-03-17 NOTE — ED Provider Notes (Signed)
CSN: [id number]     Arrival date & time 03/17/15  1157 History   First MD Initiated Contact with Patient 03/17/15 1209     Chief Complaint  Patient presents with  . Altered Mental Status  . Fall     (Consider location/radiation/quality/duration/timing/severity/associated sxs/prior Treatment) HPI Comments: Level 5 caveat. Family states that four the last 3 days pt is not no responding to questions and has been more lethargic. No fever. Pt has not been complaining of anything. Family states that she fell going up the stair 6 days ago. No loc with fall. 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  The history is provided by a relative. No language interpreter was used.    Past Medical History  Diagnosis Date  . Hypertension   . Renal insufficiency   . Arthritis   . Heart murmur   . Irregular heartbeat    Past Surgical History  Procedure Laterality Date  . Cholecystectomy    . Cesarean section    . Tubal ligation    . Abdominal hysterectomy     No family history on file. History  Substance Use Topics  . Smoking status: Never Smoker   . Smokeless tobacco: Never Used  . Alcohol Use: No   OB History    No data available     Review of Systems  Unable to perform ROS     Allergies  Enalapril  Home Medications   Prior to Admission medications   Medication Sig Start Date End Date Taking? Authorizing Provider  Cholecalciferol (VITAMIN D PO) Take by mouth.   Yes Historical Provider, MD  cholestyramine Lanetta Inch) 4 G packet Take 4 g by mouth Nightly.   Yes Historical Provider, MD  Escitalopram Oxalate (LEXAPRO PO) Take by mouth.   Yes Historical Provider, MD  oxyCODONE-acetaminophen (PERCOCET) 10-325 MG per tablet Take 1 tablet by mouth every 4 (four) hours as needed for pain.   Yes Historical Provider, MD  rifaximin (XIFAXAN) 550 MG TABS tablet Take 550 mg by mouth 3 (three) times daily.    Yes Historical Provider, MD  ondansetron (ZOFRAN ODT) 4 MG disintegrating tablet Take 1 tablet (4 mg total) by mouth every 8 (eight) hours as needed for nausea or vomiting. 12/18/14   Teressa Lower, NP   BP 125/73 mmHg  Pulse 125  Temp(Src) 99.3 F (37.4 C) (Rectal)  Resp 18  SpO2 100% Physical Exam  Constitutional: She appears well-developed and well-nourished.  HENT:  Right Ear: External ear normal.  Left Ear: External ear normal.  Abrasion on bridge of nose  Eyes: Conjunctivae and EOM are normal.  Neck: Normal range of motion. Neck supple.  Pulmonary/Chest: Effort normal and breath sounds normal.  Abdominal: Soft. Bowel sounds are normal. There is tenderness.  Musculoskeletal: Normal range of motion. She exhibits edema. She exhibits no tenderness.  Neurological: She is alert. She exhibits normal muscle tone. Coordination normal.  Pt moving all extremities. Not following commands. She is asking to go home. Doesn't answer any questions  Skin: Skin is warm and dry.  Psychiatric: She has a normal mood and affect.  Nursing note and vitals reviewed.   ED Course  Procedures (including critical care time) Labs Review Labs Reviewed  COMPREHENSIVE METABOLIC PANEL - Abnormal; Notable for the following:    Potassium 3.1 (*)    Chloride 114 (*)    CO2 18 (*)  BUN 32 (*)    Creatinine, Ser 1.91 (*)    Calcium 8.3 (*)    Total Protein 4.6 (*)    Albumin 2.2 (*)    GFR calc non Af Amer 27 (*)    GFR calc Af Amer 32 (*)    All other components within normal limits  URINALYSIS, ROUTINE W REFLEX MICROSCOPIC (NOT AT [facility]) - Abnormal; Notable for the following:    Hgb urine dipstick MODERATE (*)    Bilirubin Urine SMALL (*)    All other components within normal limits  CBC WITH DIFFERENTIAL/PLATELET - Abnormal; Notable for the following:    RBC 1.96 (*)    Hemoglobin 6.8 (*)    HCT 19.3 (*)    MCH 34.7 (*)    RDW 15.9 (*)    All other components within normal limits  URINE  MICROSCOPIC-ADD ON - Abnormal; Notable for the following:    Casts HYALINE CASTS (*)    Crystals CA OXALATE CRYSTALS (*)    All other components within normal limits  I-STAT CG4 LACTIC ACID, ED - Abnormal; Notable for the following:    Lactic Acid, Venous 2.26 (*)    All other components within normal limits  CULTURE, BLOOD (ROUTINE X 2)  CULTURE, BLOOD (ROUTINE X 2)  TROPONIN I  CBG MONITORING, ED    Imaging Review Ct Abdomen Pelvis Wo Contrast  03/17/2015   CLINICAL DATA:  Generalized abdominal pain with nausea and diarrhea  EXAM: CT ABDOMEN AND PELVIS WITHOUT CONTRAST  TECHNIQUE: Multidetector CT imaging of the abdomen and pelvis was performed following the standard protocol without oral or intravenous contrast maternal administration.  COMPARISON:  December 15, 2013  FINDINGS: Lung bases are clear. Heart is prominent. There are foci of coronary artery calcification.  Liver is prominent, measuring 17.8 cm in length. There is diffuse decreased liver attenuation consistent with hepatic steatosis. No focal liver lesions are identified. Gallbladder is absent. There is no appreciable biliary duct dilatation.  Spleen, pancreas, and adrenals appear normal.  There is a 1.4 x 0.9 cm calculus in the upper pole of the right kidney, nonobstructing. There is no right renal mass or hydronephrosis. There is a 3 mm nonobstructing calculus in the upper pole the left kidney. There is no left renal mass or hydronephrosis. There is no ureteral calculus on either side.  In the pelvis, the urinary bladder is decompressed with a catheter. Air within the urinary bladder is felt to be iatrogenic. There is no pelvic mass or pelvic fluid collection. Uterus is absent.  There is generalized anasarca with edema throughout the abdominal and pelvic walls.  There is generalized small bowel dilatation with a transition zone at the level of the terminal ileum. Colon is not distended. There is no free air or portal venous air.  There is  no demonstrable ascites, adenopathy, or abscess in the abdomen or pelvis. There is atherosclerotic change in aorta but no demonstrable aneurysm. There is degenerative change in the lumbar spine. There are no blastic or lytic bone lesions.  IMPRESSION: Small bowel obstruction with transition zone at the level of the terminal ileum. No free air.  Diffuse anasarca.  Prominent liver with hepatic steatosis.  Nonobstructing calculi in each kidney. No hydronephrosis on either side.  No abscess.  Uterus and gallbladder absent.  These results were called by telephone at the time of interpretation on 03/17/2015 at 2:18 pm to [facility], who verbally acknowledged these results.   Electronically Signed   By:  Bretta Bang III M.D.   On: 03/17/2015 14:18   Dg Chest 2 View  03/17/2015   CLINICAL DATA:  Altered mental status feet.  Recent fall.  EXAM: CHEST  2 VIEW  COMPARISON:  07/04/2013 report, images not available.  FINDINGS: Lungs are clear. Heart and mediastinum are within normal limits. The trachea is midline. No large pleural effusions. Degenerative changes in the thoracic spine.  IMPRESSION: No active cardiopulmonary disease.   Electronically Signed   By: Richarda Overlie M.D.   On: 03/17/2015 14:15   Ct Head Wo Contrast  03/17/2015   CLINICAL DATA:  Altered mental status status post fall.  EXAM: CT HEAD WITHOUT CONTRAST  TECHNIQUE: Contiguous axial images were obtained from the base of the skull through the vertex without intravenous contrast.  COMPARISON:  CT scan of October 24, 2013.  FINDINGS: Bony calvarium appears intact. Mild chronic ischemic white matter disease is noted. No mass effect or midline shift is noted. Ventricular size is within normal limits. There is no evidence of mass lesion, hemorrhage or acute infarction.  IMPRESSION: Mild chronic ischemic white matter disease. No acute intracranial abnormality seen.   Electronically Signed   By: Lupita Raider, M.D.   On: 03/17/2015 14:09     EKG  Interpretation   Date/Time:  Saturday March 17 2015 12:14:58 EDT Ventricular Rate:  110 PR Interval:  166 QRS Duration: 70 QT Interval:  312 QTC Calculation: 422 R Axis:   -11 Text Interpretation:  Sinus tachycardia Low voltage QRS Borderline ECG  Confirmed by ZACKOWSKI  MD, SCOTT ([id number]) on 03/17/2015 12:24:21 PM      MDM   Final diagnoses:  Altered mental status  Renal insufficiency  Anemia, unspecified anemia type  SBO (small bowel obstruction)  Edema    Pt to be admitted to [place] for sbo and altered mental status. Spoke with Dr. Epifania Gore with surgery and Dr. Victorino December with hospitalist    Teressa Lower, NP 03/17/15 [phone]

## 2015-03-17 NOTE — ED Notes (Addendum)
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

## 2015-03-22 LAB — CULTURE, BLOOD (ROUTINE X 2): Culture: NO GROWTH

## 2015-03-23 LAB — CULTURE, BLOOD (ROUTINE X 2): CULTURE: NO GROWTH

## 2016-07-23 ENCOUNTER — Encounter (HOSPITAL_BASED_OUTPATIENT_CLINIC_OR_DEPARTMENT_OTHER): Payer: Self-pay | Admitting: *Deleted

## 2016-07-23 ENCOUNTER — Inpatient Hospital Stay (HOSPITAL_BASED_OUTPATIENT_CLINIC_OR_DEPARTMENT_OTHER)
Admission: EM | Admit: 2016-07-23 | Discharge: 2016-07-29 | DRG: 305 | Disposition: A | Discharge status: Home / Self Care | Payer: BLUE CROSS/BLUE SHIELD | Attending: Internal Medicine | Admitting: Internal Medicine

## 2016-07-23 DIAGNOSIS — R112 Nausea with vomiting, unspecified: Secondary | ICD-10-CM | POA: Diagnosis present

## 2016-07-23 DIAGNOSIS — E872 Acidosis: Secondary | ICD-10-CM | POA: Diagnosis present

## 2016-07-23 DIAGNOSIS — R079 Chest pain, unspecified: Secondary | ICD-10-CM

## 2016-07-23 DIAGNOSIS — Z87891 Personal history of nicotine dependence: Secondary | ICD-10-CM

## 2016-07-23 DIAGNOSIS — I10 Essential (primary) hypertension: Secondary | ICD-10-CM

## 2016-07-23 DIAGNOSIS — Z79891 Long term (current) use of opiate analgesic: Secondary | ICD-10-CM

## 2016-07-23 DIAGNOSIS — D631 Anemia in chronic kidney disease: Secondary | ICD-10-CM | POA: Diagnosis present

## 2016-07-23 DIAGNOSIS — I129 Hypertensive chronic kidney disease with stage 1 through stage 4 chronic kidney disease, or unspecified chronic kidney disease: Secondary | ICD-10-CM | POA: Diagnosis present

## 2016-07-23 DIAGNOSIS — Z8249 Family history of ischemic heart disease and other diseases of the circulatory system: Secondary | ICD-10-CM

## 2016-07-23 DIAGNOSIS — N179 Acute kidney failure, unspecified: Secondary | ICD-10-CM | POA: Diagnosis present

## 2016-07-23 DIAGNOSIS — R0789 Other chest pain: Secondary | ICD-10-CM

## 2016-07-23 DIAGNOSIS — N390 Urinary tract infection, site not specified: Secondary | ICD-10-CM | POA: Diagnosis present

## 2016-07-23 DIAGNOSIS — K7460 Unspecified cirrhosis of liver: Secondary | ICD-10-CM | POA: Diagnosis present

## 2016-07-23 DIAGNOSIS — E876 Hypokalemia: Secondary | ICD-10-CM | POA: Diagnosis not present

## 2016-07-23 DIAGNOSIS — E860 Dehydration: Secondary | ICD-10-CM | POA: Diagnosis present

## 2016-07-23 DIAGNOSIS — Z9114 Patient's other noncompliance with medication regimen: Secondary | ICD-10-CM

## 2016-07-23 DIAGNOSIS — I248 Other forms of acute ischemic heart disease: Secondary | ICD-10-CM | POA: Diagnosis present

## 2016-07-23 DIAGNOSIS — G8929 Other chronic pain: Secondary | ICD-10-CM | POA: Diagnosis present

## 2016-07-23 DIAGNOSIS — I161 Hypertensive emergency: Principal | ICD-10-CM | POA: Diagnosis present

## 2016-07-23 DIAGNOSIS — R7989 Other specified abnormal findings of blood chemistry: Secondary | ICD-10-CM

## 2016-07-23 DIAGNOSIS — B961 Klebsiella pneumoniae [K. pneumoniae] as the cause of diseases classified elsewhere: Secondary | ICD-10-CM | POA: Diagnosis present

## 2016-07-23 DIAGNOSIS — R778 Other specified abnormalities of plasma proteins: Secondary | ICD-10-CM

## 2016-07-23 DIAGNOSIS — G4700 Insomnia, unspecified: Secondary | ICD-10-CM | POA: Diagnosis present

## 2016-07-23 DIAGNOSIS — Z79899 Other long term (current) drug therapy: Secondary | ICD-10-CM

## 2016-07-23 DIAGNOSIS — R197 Diarrhea, unspecified: Secondary | ICD-10-CM | POA: Diagnosis present

## 2016-07-23 DIAGNOSIS — R109 Unspecified abdominal pain: Secondary | ICD-10-CM

## 2016-07-23 DIAGNOSIS — N183 Chronic kidney disease, stage 3 (moderate): Secondary | ICD-10-CM | POA: Diagnosis present

## 2016-07-23 DIAGNOSIS — R739 Hyperglycemia, unspecified: Secondary | ICD-10-CM | POA: Diagnosis not present

## 2016-07-23 DIAGNOSIS — R1110 Vomiting, unspecified: Secondary | ICD-10-CM

## 2016-07-23 HISTORY — DX: Liver disease, unspecified: K76.9

## 2016-07-23 LAB — CBC WITH DIFFERENTIAL/PLATELET
BASOS ABS: 0 10*3/uL (ref 0.0–0.1)
Basophils Relative: 0 %
EOS ABS: 0 10*3/uL (ref 0.0–0.7)
Eosinophils Relative: 0 %
HCT: 28.8 % — ABNORMAL LOW (ref 36.0–46.0)
HEMOGLOBIN: 9.7 g/dL — AB (ref 12.0–15.0)
LYMPHS ABS: 1.4 10*3/uL (ref 0.7–4.0)
LYMPHS PCT: 14 %
MCH: 32.4 pg (ref 26.0–34.0)
MCHC: 33.7 g/dL (ref 30.0–36.0)
MCV: 96.3 fL (ref 78.0–100.0)
Monocytes Absolute: 0.4 10*3/uL (ref 0.1–1.0)
Monocytes Relative: 4 %
NEUTROS PCT: 82 %
Neutro Abs: 8 10*3/uL — ABNORMAL HIGH (ref 1.7–7.7)
Platelets: 294 10*3/uL (ref 150–400)
RBC: 2.99 MIL/uL — ABNORMAL LOW (ref 3.87–5.11)
RDW: 14.5 % (ref 11.5–15.5)
WBC: 9.8 10*3/uL (ref 4.0–10.5)

## 2016-07-23 LAB — URINALYSIS, ROUTINE W REFLEX MICROSCOPIC
BILIRUBIN URINE: NEGATIVE
GLUCOSE, UA: NEGATIVE mg/dL
Ketones, ur: NEGATIVE mg/dL
Nitrite: NEGATIVE
Protein, ur: 100 mg/dL — AB
SPECIFIC GRAVITY, URINE: 1.007 (ref 1.005–1.030)
pH: 6 (ref 5.0–8.0)

## 2016-07-23 LAB — COMPREHENSIVE METABOLIC PANEL
ALBUMIN: 4.2 g/dL (ref 3.5–5.0)
ALK PHOS: 318 U/L — AB (ref 38–126)
ALT: 10 U/L — AB (ref 14–54)
AST: 16 U/L (ref 15–41)
Anion gap: 9 (ref 5–15)
BUN: 39 mg/dL — AB (ref 6–20)
CALCIUM: 8.8 mg/dL — AB (ref 8.9–10.3)
CHLORIDE: 117 mmol/L — AB (ref 101–111)
CO2: 14 mmol/L — ABNORMAL LOW (ref 22–32)
CREATININE: 3.23 mg/dL — AB (ref 0.44–1.00)
GFR calc non Af Amer: 14 mL/min — ABNORMAL LOW (ref >60)
GFR, EST AFRICAN AMERICAN: 16 mL/min — AB (ref >60)
GLUCOSE: 89 mg/dL (ref 65–99)
Potassium: 3.9 mmol/L (ref 3.5–5.1)
Sodium: 140 mmol/L (ref 135–145)
Total Bilirubin: 0.4 mg/dL (ref 0.3–1.2)
Total Protein: 7.2 g/dL (ref 6.5–8.1)

## 2016-07-23 LAB — URINE MICROSCOPIC-ADD ON

## 2016-07-23 LAB — LIPASE, BLOOD: Lipase: 48 U/L (ref 11–51)

## 2016-07-23 MED ORDER — DEXTROSE 5 % IV SOLN
1.0000 g | Freq: Once | INTRAVENOUS | Status: AC
  Administered 2016-07-23: 1 g via INTRAVENOUS
  Filled 2016-07-23: qty 10

## 2016-07-23 MED ORDER — ONDANSETRON HCL 4 MG/2ML IJ SOLN
4.0000 mg | Freq: Once | INTRAMUSCULAR | Status: AC
  Administered 2016-07-23: 4 mg via INTRAVENOUS
  Filled 2016-07-23: qty 2

## 2016-07-23 MED ORDER — LABETALOL HCL 5 MG/ML IV SOLN
10.0000 mg | Freq: Once | INTRAVENOUS | Status: AC
  Administered 2016-07-23: 10 mg via INTRAVENOUS

## 2016-07-23 MED ORDER — SODIUM CHLORIDE 0.9 % IV BOLUS (SEPSIS)
1000.0000 mL | Freq: Once | INTRAVENOUS | Status: AC
Start: 2016-07-23 — End: 2016-07-23
  Administered 2016-07-23: 1000 mL via INTRAVENOUS

## 2016-07-23 MED ORDER — LABETALOL HCL 5 MG/ML IV SOLN
10.0000 mg | Freq: Once | INTRAVENOUS | Status: AC
  Administered 2016-07-23: 10 mg via INTRAVENOUS
  Filled 2016-07-23: qty 4

## 2016-07-23 MED ORDER — HYDRALAZINE HCL 20 MG/ML IJ SOLN
20.0000 mg | Freq: Once | INTRAMUSCULAR | Status: AC
Start: 2016-07-23 — End: 2016-07-23
  Administered 2016-07-23: 20 mg via INTRAVENOUS
  Filled 2016-07-23: qty 1

## 2016-07-23 NOTE — ED Notes (Signed)
Patient ambulated to the restroom without difficulty.

## 2016-07-23 NOTE — ED Triage Notes (Signed)
Pt reports she takes oxycodone 10mg  twice a day for chronic pain. Her medications were lost and her PCP cannot write for more for 2 weeks. Pt c/o n/v today after taking gabapentin (new med) and elevated b/p

## 2016-07-23 NOTE — ED Notes (Signed)
Patient ambulated to bathroom with stand by family assist.

## 2016-07-23 NOTE — ED Notes (Signed)
Patient ambulated to the restroom without assistance 

## 2016-07-23 NOTE — ED Provider Notes (Signed)
[facility]-EMERGENCY DEPT Provider Note   CSN: [id number] Arrival date & time: 07/23/16  1443     History   Chief Complaint Chief Complaint  Patient presents with  . Emesis    HPI Heather Vasquez is a 65 y.o. female.  HPI Patient presents with vomiting and diarrhea starting this morning. Multiple episodes of each.complains of ongoing lower abdominal pain. She also endorses urinary frequency.Denies blood in stool or vomit.Has had subjective fevers and chills. Patient lost all of her medication. Given refill by her primary physician except for her oxycodone which she takes twice daily for chronic abdominal and low back pain. Per daughter patient's had similar symptoms when she ran of her pain medication in the past. Past Medical History:  Diagnosis Date  . Arthritis   . Heart murmur   . Hypertension   . Irregular heartbeat   . Liver disease   . Renal insufficiency     Patient Active Problem List   Diagnosis Date Noted  . Acute kidney injury (nontraumatic) (HCC) 07/24/2016  . Hypertensive emergency 07/24/2016  . Nausea vomiting and diarrhea 07/24/2016  . Chronic abdominal pain 07/24/2016  . UTI (urinary tract infection) 07/24/2016  . Chest pain at rest 07/24/2016    Past Surgical History:  Procedure Laterality Date  . ABDOMINAL HYSTERECTOMY    . CESAREAN SECTION    . CHOLECYSTECTOMY    . TUBAL LIGATION      OB History    No data available       Home Medications    Prior to Admission medications   Medication Sig Start Date End Date Taking? Authorizing Provider  carvedilol (COREG) 25 MG tablet Take 25 mg by mouth 2 (two) times daily. 05/05/16  Yes Historical Provider, MD  gabapentin (NEURONTIN) 100 MG capsule Take 100 mg by mouth daily.   Yes Historical Provider, MD  Magnesium Oxide 250 MG TABS Take 250 mg by mouth daily.    Yes Historical Provider, MD  ondansetron (ZOFRAN ODT) 4 MG disintegrating tablet Take 1 tablet (4 mg total) by mouth every 8 (eight) hours as  needed for nausea or vomiting. 12/18/14  Yes Teressa Lower, NP  oxyCODONE-acetaminophen (PERCOCET) 10-325 MG per tablet Take 1 tablet by mouth 2 (two) times daily.    Yes Historical Provider, MD  spironolactone (ALDACTONE) 25 MG tablet Take 25 mg by mouth daily.   Yes Historical Provider, MD  traZODone (DESYREL) 100 MG tablet Take 100 mg by mouth at bedtime.   Yes Historical Provider, MD  Vitamin D, Ergocalciferol, (DRISDOL) 50000 units CAPS capsule Take 50,000 Units by mouth 2 (two) times a week. On Tuesday and Thursday. 07/03/16  Yes Historical Provider, MD    Family History No family history on file.  Social History Social History  Substance Use Topics  . Smoking status: Never Smoker  . Smokeless tobacco: Never Used  . Alcohol use No     Allergies   Enalapril   Review of Systems Review of Systems  Constitutional: Positive for chills and fever.  Respiratory: Negative for shortness of breath.   Cardiovascular: Negative for chest pain.  Gastrointestinal: Positive for abdominal pain, diarrhea, nausea and vomiting. Negative for blood in stool.  Genitourinary: Positive for frequency. Negative for difficulty urinating, dysuria and flank pain.  Musculoskeletal: Positive for back pain and myalgias. Negative for neck pain and neck stiffness.  Skin: Negative for rash and wound.  Neurological: Negative for dizziness, weakness, light-headedness, numbness and headaches.  All other systems reviewed and are negative.  Physical Exam Updated Vital Signs BP (!) 177/101 (BP Location: Right Arm)   Pulse (!) 116   Temp 98.8 F (37.1 C) (Oral)   Resp (!) 22   Ht 5\' 2"  (1.575 m)   Wt 167 lb 8.8 oz (76 kg)   SpO2 100%   BMI 30.65 kg/m   Physical Exam  Constitutional: She is oriented to person, place, and time. She appears well-developed and well-nourished. No distress.  HENT:  Head: Normocephalic and atraumatic.  Mouth/Throat: Oropharynx is clear and moist.  Eyes: EOM are normal.  Pupils are equal, round, and reactive to light.  Neck: Normal range of motion. Neck supple.  Cardiovascular: Normal rate and regular rhythm.  Exam reveals no gallop and no friction rub.   No murmur heard. Pulmonary/Chest: Effort normal and breath sounds normal.  Abdominal: Soft. Bowel sounds are normal. She exhibits no distension. There is tenderness (mild lower abdominal tendernesswithout focality, rebound or guarding.). There is no rebound and no guarding. No hernia.  Musculoskeletal: Normal range of motion. She exhibits no edema or tenderness.  Diffuse lumbar paraspinal tenderness to palpation. No midline thoracic or lumbar tenderness. No CVA tenderness. No lower extremity swelling, asymmetry or tenderness. 2+ distal pulses in all extremities.  Neurological: She is alert and oriented to person, place, and time.  moves all extremities without deficit. Sensation fully intact.  Skin: Skin is warm and dry. Capillary refill takes less than 2 seconds. No rash noted. No erythema.  Psychiatric: She has a normal mood and affect. Her behavior is normal.  Nursing note and vitals reviewed.    ED Treatments / Results  Labs (all labs ordered are listed, but only abnormal results are displayed) Labs Reviewed  CBC WITH DIFFERENTIAL/PLATELET - Abnormal; Notable for the following:       Result Value   RBC 2.99 (*)    Hemoglobin 9.7 (*)    HCT 28.8 (*)    Neutro Abs 8.0 (*)    All other components within normal limits  COMPREHENSIVE METABOLIC PANEL - Abnormal; Notable for the following:    Chloride 117 (*)    CO2 14 (*)    BUN 39 (*)    Creatinine, Ser 3.23 (*)    Calcium 8.8 (*)    ALT 10 (*)    Alkaline Phosphatase 318 (*)    GFR calc non Af Amer 14 (*)    GFR calc Af Amer 16 (*)    All other components within normal limits  URINALYSIS, ROUTINE W REFLEX MICROSCOPIC (NOT AT [facility]) - Abnormal; Notable for the following:    Hgb urine dipstick SMALL (*)    Protein, ur 100 (*)    Leukocytes, UA  MODERATE (*)    All other components within normal limits  URINE MICROSCOPIC-ADD ON - Abnormal; Notable for the following:    Squamous Epithelial / LPF 0-5 (*)    Bacteria, UA FEW (*)    All other components within normal limits  TROPONIN I - Abnormal; Notable for the following:    Troponin I 0.35 (*)    All other components within normal limits  COMPREHENSIVE METABOLIC PANEL - Abnormal; Notable for the following:    Potassium 3.3 (*)    Chloride 117 (*)    CO2 13 (*)    Glucose, Bld 144 (*)    BUN 34 (*)    Creatinine, Ser 3.36 (*)    Calcium 8.4 (*)    ALT 12 (*)    Alkaline Phosphatase 318 (*)  GFR calc non Af Amer 14 (*)    GFR calc Af Amer 16 (*)    All other components within normal limits  MAGNESIUM - Abnormal; Notable for the following:    Magnesium 0.8 (*)    All other components within normal limits  CBC WITH DIFFERENTIAL/PLATELET - Abnormal; Notable for the following:    WBC 11.3 (*)    RBC 3.33 (*)    Hemoglobin 10.5 (*)    HCT 31.3 (*)    Neutro Abs 9.2 (*)    All other components within normal limits  TROPONIN I - Abnormal; Notable for the following:    Troponin I 0.35 (*)    All other components within normal limits  TROPONIN I - Abnormal; Notable for the following:    Troponin I 0.26 (*)    All other components within normal limits  TROPONIN I - Abnormal; Notable for the following:    Troponin I 0.30 (*)    All other components within normal limits  MRSA PCR SCREENING  URINE CULTURE  GASTROINTESTINAL PANEL BY PCR, STOOL (REPLACES STOOL CULTURE)  C DIFFICILE QUICK SCREEN W PCR REFLEX  LIPASE, BLOOD  HEPARIN LEVEL (UNFRACTIONATED)  PHOSPHORUS  SODIUM, URINE, RANDOM  RAPID URINE DRUG SCREEN, HOSP PERFORMED  TSH  UREA NITROGEN, URINE  NOROVIRUS GROUP 1 & 2 BY PCR, STOOL  HEMOGLOBIN A1C  HEPARIN LEVEL (UNFRACTIONATED)  COMPREHENSIVE METABOLIC PANEL  MAGNESIUM  PHOSPHORUS  CBC WITH DIFFERENTIAL/PLATELET    EKG  EKG  Interpretation  Date/Time:  Thursday July 24 2016 00:16:41 EDT Ventricular Rate:  107 PR Interval:    QRS Duration: 81 QT Interval:  322 QTC Calculation: 430 R Axis:   -35 Text Interpretation:  Sinus tachycardia Left axis deviation Borderline T abnormalities, lateral leads When compared with ECG of 03/17/2015, Low voltage QRS is no longer Present Confirmed by [facility]  MD, Lynard Postlewait ([id number]) on 07/24/2016 12:27:40 AM       Radiology US Renal  Result Date: 07/24/2016 CLINICAL DATA:  Hypertension and liver disease EXAM: RENAL / URINARY TRACT ULTRASOUND COMPLETE COMPARISON:  CT abdomen pelvis 04/19/2015 FINDINGS: Right Kidney: Length: 9.4 cm. There is increased echogenicity of the right kidney. Near the lower pole, there is a shadowing calculus measuring 12 mm. No hydronephrosis or solid renal mass. Left Kidney: Length: 9.5 cm. There is increased echogenicity of the left kidney. There is a 7 mm nonobstructive renal calculus seen. There is a renal cyst measuring 2.0 x 1.4 x 1.6 cm. No hydronephrosis. Bladder: Appears normal for degree of bladder distention. IMPRESSION: 1. Increased echogenicity of both kidneys, suggesting chronic medical renal disease. 2. Bilateral nonobstructing nephrolithiasis with largest stone measuring 1.2 cm. Electronically Signed   By: Deatra Robinson M.D.   On: 07/24/2016 22:52   Portable Chest 1 View  Result Date: 07/24/2016 CLINICAL DATA:  Acute kidney injury EXAM: PORTABLE CHEST 1 VIEW COMPARISON:  04/19/2015 FINDINGS: The heart size and mediastinal contours are within normal limits. Both lungs are clear. The visualized skeletal structures are unremarkable. IMPRESSION: No active disease. Electronically Signed   By: Signa Kell M.D.   On: 07/24/2016 09:28   Dg Abd Portable 1v  Result Date: 07/24/2016 CLINICAL DATA:  Acute kidney injury.  Back pain. EXAM: PORTABLE ABDOMEN - 1 VIEW COMPARISON:  04/26/2015 FINDINGS: The bowel gas pattern is normal. Large stone in the right  kidney is again noted measuring 1.4 cm. IMPRESSION: 1. Nonobstructive bowel gas pattern. 2. Right renal stone. Electronically Signed   By:  Signa Kellaylor  Stroud M.D.   On: 07/24/2016 09:30    Procedures Procedures (including critical care time)  Medications Ordered in ED Medications  heparin ADULT infusion 100 units/mL (25000 units/27950mL sodium chloride 0.45%) (900 Units/hr Intravenous Rate/Dose Verify 07/24/16 2000)  cefTRIAXone (ROCEPHIN) 1 g in dextrose 5 % 50 mL IVPB (1 g Intravenous Given 07/24/16 1857)  ondansetron (ZOFRAN) injection 4 mg (4 mg Intravenous Given 07/24/16 2239)  traZODone (DESYREL) tablet 100 mg (100 mg Oral Given 07/24/16 2241)  sodium chloride flush (NS) 0.9 % injection 3 mL (3 mLs Intravenous Given 07/24/16 2200)  acetaminophen (TYLENOL) tablet 650 mg (not administered)    Or  acetaminophen (TYLENOL) suppository 650 mg (not administered)  aspirin EC tablet 325 mg (325 mg Oral Given 07/24/16 0800)  morphine 2 MG/ML injection 2 mg (not administered)  0.9 %  sodium chloride infusion ( Intravenous Rate/Dose Verify 07/24/16 2000)  nicardipine (CARDENE) 20mg  in 0.86% saline 200ml IV infusion (0.1 mg/ml) (5 mg/hr Intravenous New Bag/Given 07/24/16 1640)  labetalol (NORMODYNE,TRANDATE) injection 10 mg (not administered)  sodium chloride 0.9 % bolus 1,000 mL (0 mLs Intravenous Stopped 07/23/16 1817)  ondansetron (ZOFRAN) injection 4 mg (4 mg Intravenous Given 07/23/16 1622)  sodium chloride 0.9 % bolus 1,000 mL (0 mLs Intravenous Stopped 07/23/16 2017)  cefTRIAXone (ROCEPHIN) 1 g in dextrose 5 % 50 mL IVPB (0 g Intravenous Stopped 07/23/16 1928)  labetalol (NORMODYNE,TRANDATE) injection 10 mg (10 mg Intravenous Given 07/23/16 1842)  labetalol (NORMODYNE,TRANDATE) injection 10 mg (10 mg Intravenous Given 07/23/16 2054)  hydrALAZINE (APRESOLINE) injection 20 mg (20 mg Intravenous Given 07/23/16 2326)  labetalol (NORMODYNE,TRANDATE) injection 20 mg (20 mg Intravenous Given 07/24/16  0025)  nitroGLYCERIN 50 mg in dextrose 5 % 250 mL (0.2 mg/mL) infusion (15 mcg/min Intravenous Rate/Dose Change 07/24/16 0700)  heparin bolus via infusion 3,000 Units (3,000 Units Intravenous Bolus from Bag 07/24/16 0137)  magnesium sulfate IVPB 2 g 50 mL (2 g Intravenous Given 07/24/16 1338)  potassium chloride SA (K-DUR,KLOR-CON) CR tablet 40 mEq (40 mEq Oral Given 07/24/16 1338)     Initial Impression / Assessment and Plan / ED Course  I have reviewed the triage vital signs and the nursing notes.  Pertinent labs & imaging results that were available during my care of the patient were reviewed by me and considered in my medical decision making (see chart for details).  Clinical Course   Discussed with Dr[facility] Tasia CatchingsAhmed, [facility]. Will accept in transfer to telemetry bed when bed becomes available.  Signed out to oncoming MD pending bed availability  Final Clinical Impressions(s) / ED Diagnoses   Final diagnoses:  Vomiting and diarrhea  AKI (acute kidney injury) (HCC)  Uncontrolled hypertension  Acute lower UTI  Elevated troponin I level  Chest discomfort    New Prescriptions Current Discharge Medication List       Loren Raceravid Rydan Gulyas, MD 07/25/16 [phone]

## 2016-07-24 ENCOUNTER — Inpatient Hospital Stay (HOSPITAL_COMMUNITY): Payer: BLUE CROSS/BLUE SHIELD

## 2016-07-24 DIAGNOSIS — Z79899 Other long term (current) drug therapy: Secondary | ICD-10-CM | POA: Diagnosis not present

## 2016-07-24 DIAGNOSIS — I129 Hypertensive chronic kidney disease with stage 1 through stage 4 chronic kidney disease, or unspecified chronic kidney disease: Secondary | ICD-10-CM | POA: Diagnosis present

## 2016-07-24 DIAGNOSIS — N183 Chronic kidney disease, stage 3 (moderate): Secondary | ICD-10-CM | POA: Diagnosis present

## 2016-07-24 DIAGNOSIS — R197 Diarrhea, unspecified: Secondary | ICD-10-CM

## 2016-07-24 DIAGNOSIS — R748 Abnormal levels of other serum enzymes: Secondary | ICD-10-CM | POA: Diagnosis not present

## 2016-07-24 DIAGNOSIS — I248 Other forms of acute ischemic heart disease: Secondary | ICD-10-CM | POA: Diagnosis present

## 2016-07-24 DIAGNOSIS — D631 Anemia in chronic kidney disease: Secondary | ICD-10-CM | POA: Diagnosis present

## 2016-07-24 DIAGNOSIS — Z79891 Long term (current) use of opiate analgesic: Secondary | ICD-10-CM | POA: Diagnosis not present

## 2016-07-24 DIAGNOSIS — I161 Hypertensive emergency: Secondary | ICD-10-CM | POA: Diagnosis present

## 2016-07-24 DIAGNOSIS — R739 Hyperglycemia, unspecified: Secondary | ICD-10-CM | POA: Diagnosis not present

## 2016-07-24 DIAGNOSIS — R0789 Other chest pain: Secondary | ICD-10-CM | POA: Diagnosis not present

## 2016-07-24 DIAGNOSIS — B961 Klebsiella pneumoniae [K. pneumoniae] as the cause of diseases classified elsewhere: Secondary | ICD-10-CM | POA: Diagnosis present

## 2016-07-24 DIAGNOSIS — R079 Chest pain, unspecified: Secondary | ICD-10-CM

## 2016-07-24 DIAGNOSIS — N3000 Acute cystitis without hematuria: Secondary | ICD-10-CM | POA: Diagnosis not present

## 2016-07-24 DIAGNOSIS — R112 Nausea with vomiting, unspecified: Secondary | ICD-10-CM | POA: Diagnosis not present

## 2016-07-24 DIAGNOSIS — G8929 Other chronic pain: Secondary | ICD-10-CM | POA: Diagnosis present

## 2016-07-24 DIAGNOSIS — E860 Dehydration: Secondary | ICD-10-CM | POA: Diagnosis present

## 2016-07-24 DIAGNOSIS — N390 Urinary tract infection, site not specified: Secondary | ICD-10-CM | POA: Diagnosis present

## 2016-07-24 DIAGNOSIS — E876 Hypokalemia: Secondary | ICD-10-CM | POA: Diagnosis not present

## 2016-07-24 DIAGNOSIS — K7460 Unspecified cirrhosis of liver: Secondary | ICD-10-CM | POA: Diagnosis present

## 2016-07-24 DIAGNOSIS — Z87891 Personal history of nicotine dependence: Secondary | ICD-10-CM | POA: Diagnosis not present

## 2016-07-24 DIAGNOSIS — E872 Acidosis: Secondary | ICD-10-CM | POA: Diagnosis present

## 2016-07-24 DIAGNOSIS — N189 Chronic kidney disease, unspecified: Secondary | ICD-10-CM

## 2016-07-24 DIAGNOSIS — G4700 Insomnia, unspecified: Secondary | ICD-10-CM | POA: Diagnosis present

## 2016-07-24 DIAGNOSIS — N179 Acute kidney failure, unspecified: Secondary | ICD-10-CM

## 2016-07-24 DIAGNOSIS — R109 Unspecified abdominal pain: Secondary | ICD-10-CM

## 2016-07-24 DIAGNOSIS — Z9114 Patient's other noncompliance with medication regimen: Secondary | ICD-10-CM | POA: Diagnosis not present

## 2016-07-24 DIAGNOSIS — Z8249 Family history of ischemic heart disease and other diseases of the circulatory system: Secondary | ICD-10-CM | POA: Diagnosis not present

## 2016-07-24 LAB — CBC WITH DIFFERENTIAL/PLATELET
Basophils Absolute: 0 10*3/uL (ref 0.0–0.1)
Basophils Relative: 0 %
Eosinophils Absolute: 0 10*3/uL (ref 0.0–0.7)
Eosinophils Relative: 0 %
HCT: 31.3 % — ABNORMAL LOW (ref 36.0–46.0)
Hemoglobin: 10.5 g/dL — ABNORMAL LOW (ref 12.0–15.0)
Lymphocytes Relative: 13 %
Lymphs Abs: 1.4 10*3/uL (ref 0.7–4.0)
MCH: 31.5 pg (ref 26.0–34.0)
MCHC: 33.5 g/dL (ref 30.0–36.0)
MCV: 94 fL (ref 78.0–100.0)
Monocytes Absolute: 0.6 10*3/uL (ref 0.1–1.0)
Monocytes Relative: 6 %
Neutro Abs: 9.2 10*3/uL — ABNORMAL HIGH (ref 1.7–7.7)
Neutrophils Relative %: 81 %
Platelets: 328 10*3/uL (ref 150–400)
RBC: 3.33 MIL/uL — ABNORMAL LOW (ref 3.87–5.11)
RDW: 15 % (ref 11.5–15.5)
WBC: 11.3 10*3/uL — ABNORMAL HIGH (ref 4.0–10.5)

## 2016-07-24 LAB — PHOSPHORUS: Phosphorus: 4.2 mg/dL (ref 2.5–4.6)

## 2016-07-24 LAB — RAPID URINE DRUG SCREEN, HOSP PERFORMED
AMPHETAMINES: NOT DETECTED
BENZODIAZEPINES: NOT DETECTED
Barbiturates: NOT DETECTED
Cocaine: NOT DETECTED
OPIATES: NOT DETECTED
TETRAHYDROCANNABINOL: NOT DETECTED

## 2016-07-24 LAB — MAGNESIUM: MAGNESIUM: 0.8 mg/dL — AB (ref 1.7–2.4)

## 2016-07-24 LAB — SODIUM, URINE, RANDOM: SODIUM UR: 89 mmol/L

## 2016-07-24 LAB — TROPONIN I
TROPONIN I: 0.26 ng/mL — AB (ref <0.03)
TROPONIN I: 0.35 ng/mL — AB (ref <0.03)
Troponin I: 0.35 ng/mL (ref <0.03)
Troponin I: 0.3 ng/mL (ref <0.03)

## 2016-07-24 LAB — MRSA PCR SCREENING: MRSA BY PCR: NEGATIVE

## 2016-07-24 LAB — COMPREHENSIVE METABOLIC PANEL
ALT: 12 U/L — ABNORMAL LOW (ref 14–54)
AST: 21 U/L (ref 15–41)
Albumin: 3.7 g/dL (ref 3.5–5.0)
Alkaline Phosphatase: 318 U/L — ABNORMAL HIGH (ref 38–126)
Anion gap: 10 (ref 5–15)
BUN: 34 mg/dL — ABNORMAL HIGH (ref 6–20)
CO2: 13 mmol/L — ABNORMAL LOW (ref 22–32)
Calcium: 8.4 mg/dL — ABNORMAL LOW (ref 8.9–10.3)
Chloride: 117 mmol/L — ABNORMAL HIGH (ref 101–111)
Creatinine, Ser: 3.36 mg/dL — ABNORMAL HIGH (ref 0.44–1.00)
GFR calc Af Amer: 16 mL/min — ABNORMAL LOW (ref >60)
GFR calc non Af Amer: 14 mL/min — ABNORMAL LOW (ref >60)
Glucose, Bld: 144 mg/dL — ABNORMAL HIGH (ref 65–99)
Potassium: 3.3 mmol/L — ABNORMAL LOW (ref 3.5–5.1)
Sodium: 140 mmol/L (ref 135–145)
Total Bilirubin: 0.7 mg/dL (ref 0.3–1.2)
Total Protein: 6.6 g/dL (ref 6.5–8.1)

## 2016-07-24 LAB — TSH: TSH: 1.023 u[IU]/mL (ref 0.350–4.500)

## 2016-07-24 LAB — HEPARIN LEVEL (UNFRACTIONATED): Heparin Unfractionated: 0.52 IU/mL (ref 0.30–0.70)

## 2016-07-24 MED ORDER — ONDANSETRON HCL 4 MG/2ML IJ SOLN
4.0000 mg | Freq: Three times a day (TID) | INTRAMUSCULAR | Status: DC | PRN
  Administered 2016-07-24: 4 mg via INTRAVENOUS
  Filled 2016-07-24: qty 2

## 2016-07-24 MED ORDER — TRAZODONE HCL 100 MG PO TABS
100.0000 mg | ORAL_TABLET | Freq: Every day | ORAL | Status: DC
  Administered 2016-07-24 – 2016-07-28 (×5): 100 mg via ORAL
  Filled 2016-07-24: qty 1
  Filled 2016-07-24: qty 2
  Filled 2016-07-24 (×3): qty 1

## 2016-07-24 MED ORDER — NITROGLYCERIN IN D5W 200-5 MCG/ML-% IV SOLN
0.0000 ug/min | Freq: Once | INTRAVENOUS | Status: AC
  Administered 2016-07-24: 5 ug/min via INTRAVENOUS
  Filled 2016-07-24: qty 250

## 2016-07-24 MED ORDER — LABETALOL HCL 5 MG/ML IV SOLN: 10.0000 mg | INTRAVENOUS | Status: DC | PRN

## 2016-07-24 MED ORDER — CHOLESTYRAMINE 4 G PO PACK: 4.0000 g | PACK | Freq: Every day | ORAL | Status: DC

## 2016-07-24 MED ORDER — ACETAMINOPHEN 650 MG RE SUPP: 650.0000 mg | Freq: Four times a day (QID) | RECTAL | Status: DC | PRN

## 2016-07-24 MED ORDER — NICARDIPINE HCL IN NACL 20-0.86 MG/200ML-% IV SOLN
3.0000 mg/h | INTRAVENOUS | Status: DC
  Administered 2016-07-24: 3 mg/h via INTRAVENOUS
  Administered 2016-07-24: 5 mg/h via INTRAVENOUS
  Filled 2016-07-24 (×2): qty 200

## 2016-07-24 MED ORDER — SODIUM CHLORIDE 0.9% FLUSH
3.0000 mL | Freq: Two times a day (BID) | INTRAVENOUS | Status: DC
  Administered 2016-07-24 – 2016-07-29 (×10): 3 mL via INTRAVENOUS

## 2016-07-24 MED ORDER — MORPHINE SULFATE (PF) 2 MG/ML IV SOLN: 2.0000 mg | INTRAVENOUS | Status: DC | PRN

## 2016-07-24 MED ORDER — ASPIRIN EC 325 MG PO TBEC
325.0000 mg | DELAYED_RELEASE_TABLET | Freq: Every day | ORAL | Status: DC
  Administered 2016-07-24 – 2016-07-25 (×2): 325 mg via ORAL
  Filled 2016-07-24 (×2): qty 1

## 2016-07-24 MED ORDER — HYDRALAZINE HCL 20 MG/ML IJ SOLN
20.0000 mg | Freq: Three times a day (TID) | INTRAMUSCULAR | Status: DC | PRN
  Administered 2016-07-24: 20 mg via INTRAVENOUS
  Filled 2016-07-24: qty 1

## 2016-07-24 MED ORDER — GABAPENTIN 100 MG PO CAPS: 100.0000 mg | ORAL_CAPSULE | Freq: Every day | ORAL | Status: DC

## 2016-07-24 MED ORDER — ACETAMINOPHEN 325 MG PO TABS
650.0000 mg | ORAL_TABLET | Freq: Four times a day (QID) | ORAL | Status: DC | PRN
  Administered 2016-07-26 – 2016-07-28 (×3): 650 mg via ORAL
  Filled 2016-07-24 (×3): qty 2

## 2016-07-24 MED ORDER — LABETALOL HCL 5 MG/ML IV SOLN
20.0000 mg | Freq: Once | INTRAVENOUS | Status: AC
  Administered 2016-07-24: 20 mg via INTRAVENOUS
  Filled 2016-07-24: qty 4

## 2016-07-24 MED ORDER — LABETALOL HCL 5 MG/ML IV SOLN
10.0000 mg | INTRAVENOUS | Status: DC | PRN
  Administered 2016-07-25: 10 mg via INTRAVENOUS
  Filled 2016-07-24: qty 4

## 2016-07-24 MED ORDER — NITROGLYCERIN IN D5W 200-5 MCG/ML-% IV SOLN: 0.0000 ug/min | INTRAVENOUS | Status: DC

## 2016-07-24 MED ORDER — HEPARIN (PORCINE) IN NACL 100-0.45 UNIT/ML-% IJ SOLN
1050.0000 [IU]/h | INTRAMUSCULAR | Status: DC
Start: 2016-07-24 — End: 2016-07-25
  Administered 2016-07-24 – 2016-07-25 (×3): 900 [IU]/h via INTRAVENOUS
  Filled 2016-07-24 (×2): qty 250

## 2016-07-24 MED ORDER — MAGNESIUM SULFATE 2 GM/50ML IV SOLN
2.0000 g | Freq: Once | INTRAVENOUS | Status: AC
  Administered 2016-07-24: 2 g via INTRAVENOUS
  Filled 2016-07-24: qty 50

## 2016-07-24 MED ORDER — RIFAXIMIN 550 MG PO TABS
550.0000 mg | ORAL_TABLET | Freq: Three times a day (TID) | ORAL | Status: DC
  Filled 2016-07-24: qty 1

## 2016-07-24 MED ORDER — SODIUM CHLORIDE 0.9 % IV SOLN: INTRAVENOUS | Status: DC

## 2016-07-24 MED ORDER — HEPARIN BOLUS VIA INFUSION
3000.0000 [IU] | Freq: Once | INTRAVENOUS | Status: AC
  Administered 2016-07-24: 3000 [IU] via INTRAVENOUS

## 2016-07-24 MED ORDER — POTASSIUM CHLORIDE CRYS ER 20 MEQ PO TBCR
40.0000 meq | EXTENDED_RELEASE_TABLET | Freq: Once | ORAL | Status: AC
  Administered 2016-07-24: 40 meq via ORAL
  Filled 2016-07-24: qty 2

## 2016-07-24 MED ORDER — SODIUM CHLORIDE 0.9 % IV SOLN
INTRAVENOUS | Status: AC
  Administered 2016-07-24: 50 mL/h via INTRAVENOUS

## 2016-07-24 MED ORDER — DEXTROSE 5 % IV SOLN
1.0000 g | INTRAVENOUS | Status: DC
  Administered 2016-07-24 – 2016-07-26 (×3): 1 g via INTRAVENOUS
  Filled 2016-07-24 (×5): qty 10

## 2016-07-24 NOTE — Progress Notes (Signed)
ANTICOAGULATION CONSULT NOTE - Initial Consult  Pharmacy Consult for heparin Indication: chest pain/ACS  Allergies  Allergen Reactions  . Enalapril     Cough     Patient Measurements: Height: 5\' 2"  (157.5 cm) Weight: 160 lb (72.6 kg) IBW/kg (Calculated) : 50.1 Heparin Dosing Weight: 65kg  Vital Signs: Temp: 99.3 F (37.4 C) (10/25 2308) Temp Source: Oral (10/25 2308) BP: 151/102 (10/26 0045) Pulse Rate: 92 (10/26 0045)  Labs:  Recent Labs  07/23/16 1605 07/24/16 0030  HGB 9.7*  --   HCT 28.8*  --   PLT 294  --   CREATININE 3.23*  --   TROPONINI  --  0.35*    Estimated Creatinine Clearance: 16.6 mL/min (by C-G formula based on SCr of 3.23 mg/dL (H)).   Medical History: Past Medical History:  Diagnosis Date  . Arthritis   . Heart murmur   . Hypertension   . Irregular heartbeat   . Liver disease   . Renal insufficiency      Assessment: 65yo female presents w/ N/V, now w/ elevated troponin, to begin heparin.  Goal of Therapy:  Heparin level 0.3-0.7 units/ml Monitor platelets by anticoagulation protocol: Yes   Plan:  Will give heparin 3000 units IV bolus x1 followed by gtt at 900 units/hr and monitor heparin levels and CBC.  Vernard GamblesVeronda Elyjah Hazan, PharmD, BCPS  07/24/2016,1:16 AM

## 2016-07-24 NOTE — Progress Notes (Signed)
Patient arrived to unit 4 east bed 10 [facility].Assistd patient to bed by nursing staff.Patient denies chest pain or shortness of breath at present time.Patient currently has IV heparin infusing at 900 unit /hour and IV nitroglycerin at 5 mcg/kg/min.Oriented patient to call bell and nursing unit.Patient at risk for fall educated about safety yellow socks and bracelet and bed alarm on.Patient verbalized understanding out of bed with assistance from nursing staff.No acute distress noted will continue to monitor.

## 2016-07-24 NOTE — Progress Notes (Signed)
65 yo F with CKD and HTN who presents with one day vomiting and diarrhea, with abdominal pain.    BP (!) 151/102   Pulse 92   Temp 99.3 F (37.4 C) (Oral)   Resp 23   Ht 5\' 2"  (1.575 m)   Wt 72.6 kg (160 lb)   SpO2 100%   BMI 29.26 kg/m   Cr baseline is 1.9, today is 3.23 with NAGMA.    She has some vague chest discomfort and severe range HTN and so troponin was requested by [facility], and this returned at 0.3 ng/mL.  EDP [facility] with [facility] who is aware.    Patient started on heparin gtt and nitroglycerin gtt, BP improved to 150/100 mmHg.  To stepdown, inpatient [facility].

## 2016-07-24 NOTE — Consult Note (Signed)
PULMONARY / CRITICAL CARE MEDICINE   Name: Heather Vasquez MRN: [id number] DOB: [birth year]    ADMISSION DATE:  07/23/2016 CONSULTATION DATE:  10/26  REFERRING MD:  Maryfrances Bunnellanford ([facility])   CHIEF COMPLAINT:  HTN crisis   HISTORY OF PRESENT ILLNESS:   65yo female with hx HTN, CKD, chronic pain and narcotic use [facility] with c/o 1 week hx nausea/vomiting/diarrhea and abd pain after being out of all of her medications for several days.  In ER had significant hypertension despite several doses of labetalol as well as mildly elevated troponin.  She was started on nitro gtt and tx [facility] and cards eval.  She was initially sent to SDU but remained significantly hypertensive despite nitro gtt, so on 0/26 PCCM consulted for tx to ICU.   PAST MEDICAL HISTORY :  She  has a past medical history of Arthritis; Heart murmur; Hypertension; Irregular heartbeat; Liver disease; and Renal insufficiency.  PAST SURGICAL HISTORY: She  has a past surgical history that includes Cholecystectomy; Cesarean section; Tubal ligation; and Abdominal hysterectomy.  Allergies  Allergen Reactions  . Enalapril Other (See Comments)    Cough     No current facility-administered medications on file prior to encounter.    Current Outpatient Prescriptions on File Prior to Encounter  Medication Sig  . ondansetron (ZOFRAN ODT) 4 MG disintegrating tablet Take 1 tablet (4 mg total) by mouth every 8 (eight) hours as needed for nausea or vomiting.  Marland Kitchen. oxyCODONE-acetaminophen (PERCOCET) 10-325 MG per tablet Take 1 tablet by mouth 2 (two) times daily.     FAMILY HISTORY:  Her has no family status information on file.    SOCIAL HISTORY: She  reports that she has never smoked. She has never used smokeless tobacco. She reports that she does not drink alcohol or use drugs.  REVIEW OF SYSTEMS:   As per HPI - All other systems reviewed and were neg.    SUBJECTIVE:    VITAL SIGNS: BP (!)  188/105   Pulse 86   Temp 98.4 F (36.9 C) (Oral)   Resp (!) 23   Ht 5\' 2"  (1.575 m)   Wt 78.8 kg (173 lb 11.6 oz)   SpO2 100%   BMI 31.77 kg/m   HEMODYNAMICS:    VENTILATOR SETTINGS:    INTAKE / OUTPUT: I/O last 3 completed shifts: In: 58.9 [I.V.:58.9] Out: -   PHYSICAL EXAMINATION: General:  Uncomfortable appearing female but NAD, eating breakfast  Neuro:  Awake, alert, appropriate, gen weakness  HEENT:  Mm dry, no JVD  Cardiovascular:  s1s2 rrr, mild tachy 110 Lungs:  resps even non labored on RA, clear  Abdomen:  Round, soft, mildly tender, +bs  Musculoskeletal:  Warm and dry, no sig edema    LABS:  BMET  Recent Labs Lab 07/23/16 1605 07/24/16 0944  NA 140 140  K 3.9 3.3*  CL 117* 117*  CO2 14* 13*  BUN 39* 34*  CREATININE 3.23* 3.36*  GLUCOSE 89 144*    Electrolytes  Recent Labs Lab 07/23/16 1605 07/24/16 0944  CALCIUM 8.8* 8.4*  MG  --  0.8*  PHOS  --  4.2    CBC  Recent Labs Lab 07/23/16 1605 07/24/16 0944  WBC 9.8 11.3*  HGB 9.7* 10.5*  HCT 28.8* 31.3*  PLT 294 328    Coag's No results for input(s): APTT, INR in the last 168 hours.  Sepsis Markers No results for input(s): LATICACIDVEN, PROCALCITON, O2SATVEN in  the last 168 hours.  ABG No results for input(s): PHART, PCO2ART, PO2ART in the last 168 hours.  Liver Enzymes  Recent Labs Lab 07/23/16 1605 07/24/16 0944  AST 16 21  ALT 10* 12*  ALKPHOS 318* 318*  BILITOT 0.4 0.7  ALBUMIN 4.2 3.7    Cardiac Enzymes  Recent Labs Lab 07/24/16 0030 07/24/16 0944  TROPONINI 0.35* 0.35*    Glucose No results for input(s): GLUCAP in the last 168 hours.  Imaging Portable Chest 1 View  Result Date: 07/24/2016 CLINICAL DATA:  Acute kidney injury EXAM: PORTABLE CHEST 1 VIEW COMPARISON:  04/19/2015 FINDINGS: The heart size and mediastinal contours are within normal limits. Both lungs are clear. The visualized skeletal structures are unremarkable. IMPRESSION: No active  disease. Electronically Signed   By: Signa Kell M.D.   On: 07/24/2016 09:28   Dg Abd Portable 1v  Result Date: 07/24/2016 CLINICAL DATA:  Acute kidney injury.  Back pain. EXAM: PORTABLE ABDOMEN - 1 VIEW COMPARISON:  04/26/2015 FINDINGS: The bowel gas pattern is normal. Large stone in the right kidney is again noted measuring 1.4 cm. IMPRESSION: 1. Nonobstructive bowel gas pattern. 2. Right renal stone. Electronically Signed   By: Signa Kell M.D.   On: 07/24/2016 09:30     STUDIES:  KUB 10/26>>>1. Nonobstructive bowel gas pattern.  2. Right renal stone. Renal u/s 10/26>>>  CULTURES: Urine 10/25>>>  ANTIBIOTICS: Ceftriaxone 10/25>>>  SIGNIFICANT EVENTS:   LINES/TUBES:   DISCUSSION: 65yo female with HTN crisis and AKI  ASSESSMENT / PLAN:   HTN crisis - in setting medication noncompliance although coreg, spironolactone are her only listed relevant home medications  PLAN -  tx ICU  Transition off nitro gtt to cardene gtt for now  PRN labetalol  Trend troponin  Cardiology to see    ?UTI  PLAN -  Continue rocephin  Urine culture pending    AKI on CKD - baseline Scr <2. Likely r/t relative dehydration in setting vomiting/diarrhea PLAN -  Gentle volume - 3L total thus far  F/u chem now and in am  Mg, phos  Hold home spironolactone    Diarrhea - watery stool x 1 week as well as n/v.  No recent abx. POS sick contacts. KUB neg.  ?narcotic withdrawal v viral gastroenteritis.  PLAN -  Check Stool o/p, norovirus, CDiff  Gentle volume as above   Hx Liver disease -- unclear etiology.  On rifaximin at home.  Denies ETOH.  PLAN -  F/u LFT's    Dirk Dress, NP 07/24/2016  10:58 AM Pager: [phone] or [phone]

## 2016-07-24 NOTE — Progress Notes (Signed)
Dr.Danford aware of patient arrival to unit .

## 2016-07-24 NOTE — ED Provider Notes (Signed)
Care assumed from Dr. Ranae Palms. Patient was set up to be admitted to [facility], but there was a delay in bed being assigned. Family agreed to have her admitted to was at long [facility]. Case was discussed with Dr. Katrinka Blazing, hospitalist at was at [facility] who requested ECG and troponin be checked prior to patient being transferred. ECG showed no acute changes, but troponin is elevated at 0.35. Blood pressure also was not adequately controlled. She was given additional labetalol and blood pressure came down to 170 systolic. When troponin came back elevated, I went in to evaluate the patient and she did endorse some chest discomfort. She was started on heparin and nitroglycerin drips because of this. Case was discussed with Dr. Antoine Poche of cardiology service who feels that the troponin leak is likely related to her renal insufficiency and other medical conditions and requests she be admitted to the hospitalist service. He will see her in consult. Case discussed with Dr. Maryfrances Bunnell of [facility] who agrees to accept the patient. She will be admitted to stepdown unit at [facility]  CRITICAL CARE Performed by: Dione Booze Total critical care time: 35 minutes Critical care time was exclusive of separately billable procedures and treating other patients. Critical care was necessary to treat or prevent imminent or life-threatening deterioration. Critical care was time spent personally by me on the following activities: development of treatment plan with patient and/or surrogate as well as nursing, discussions with consultants, evaluation of patient's response to treatment, examination of patient, obtaining history from patient or surrogate, ordering and performing treatments and interventions, ordering and review of laboratory studies, ordering and review of radiographic studies, pulse oximetry and re-evaluation of patient's condition.   EKG  Interpretation  Date/Time:  Thursday July 24 2016 00:16:41 EDT Ventricular Rate:  107 PR Interval:    QRS Duration: 81 QT Interval:  322 QTC Calculation: 430 R Axis:   -35 Text Interpretation:  Sinus tachycardia Left axis deviation Borderline T abnormalities, lateral leads When compared with ECG of 03/17/2015, Low voltage QRS is no longer Present Confirmed by [facility]  MD, Soo Steelman ([id number]) on 07/24/2016 12:27:40 AM      Results for orders placed or performed during the hospital encounter of 07/23/16  CBC with Differential/Platelet  Result Value Ref Range   WBC 9.8 4.0 - 10.5 K/uL   RBC 2.99 (L) 3.87 - 5.11 MIL/uL   Hemoglobin 9.7 (L) 12.0 - 15.0 g/dL   HCT 52.8 (L) 41.3 - 24.4 %   MCV 96.3 78.0 - 100.0 fL   MCH 32.4 26.0 - 34.0 pg   MCHC 33.7 30.0 - 36.0 g/dL   RDW 01.0 27.2 - 53.6 %   Platelets 294 150 - 400 K/uL   Neutrophils Relative % 82 %   Neutro Abs 8.0 (H) 1.7 - 7.7 K/uL   Lymphocytes Relative 14 %   Lymphs Abs 1.4 0.7 - 4.0 K/uL   Monocytes Relative 4 %   Monocytes Absolute 0.4 0.1 - 1.0 K/uL   Eosinophils Relative 0 %   Eosinophils Absolute 0.0 0.0 - 0.7 K/uL   Basophils Relative 0 %   Basophils Absolute 0.0 0.0 - 0.1 K/uL  Comprehensive metabolic panel  Result Value Ref Range   Sodium 140 135 - 145 mmol/L   Potassium 3.9 3.5 - 5.1 mmol/L   Chloride 117 (H) 101 - 111 mmol/L   CO2 14 (L) 22 - 32 mmol/L   Glucose, Bld 89 65 - 99 mg/dL  BUN 39 (H) 6 - 20 mg/dL   Creatinine, Ser 1.613.23 (H) 0.44 - 1.00 mg/dL   Calcium 8.8 (L) 8.9 - 10.3 mg/dL   Total Protein 7.2 6.5 - 8.1 g/dL   Albumin 4.2 3.5 - 5.0 g/dL   AST 16 15 - 41 U/L   ALT 10 (L) 14 - 54 U/L   Alkaline Phosphatase 318 (H) 38 - 126 U/L   Total Bilirubin 0.4 0.3 - 1.2 mg/dL   GFR calc non Af Amer 14 (L) >60 mL/min   GFR calc Af Amer 16 (L) >60 mL/min   Anion gap 9 5 - 15  Lipase, blood  Result Value Ref Range   Lipase 48 11 - 51 U/L  Urinalysis, Routine w reflex microscopic (not at [facility])  Result Value  Ref Range   Color, Urine YELLOW YELLOW   APPearance CLEAR CLEAR   Specific Gravity, Urine 1.007 1.005 - 1.030   pH 6.0 5.0 - 8.0   Glucose, UA NEGATIVE NEGATIVE mg/dL   Hgb urine dipstick SMALL (A) NEGATIVE   Bilirubin Urine NEGATIVE NEGATIVE   Ketones, ur NEGATIVE NEGATIVE mg/dL   Protein, ur 096100 (A) NEGATIVE mg/dL   Nitrite NEGATIVE NEGATIVE   Leukocytes, UA MODERATE (A) NEGATIVE  Urine microscopic-add on  Result Value Ref Range   Squamous Epithelial / LPF 0-5 (A) NONE SEEN   WBC, UA 6-30 0 - 5 WBC/hpf   RBC / HPF 0-5 0 - 5 RBC/hpf   Bacteria, UA FEW (A) NONE SEEN  Troponin I  Result Value Ref Range   Troponin I 0.35 (HH) <0.03 ng/mL      Dione Boozeavid Rozanna Cormany, MD 07/24/16 0150

## 2016-07-24 NOTE — Progress Notes (Signed)
  2D Echocardiogram has been performed.  Delcie RochENNINGTON, Zada Haser 07/24/2016, 6:35 PM

## 2016-07-24 NOTE — ED Notes (Addendum)
Pt's daughter is going home and will [facility].  If needed, her phone number is, [phone] Viona GilmoreSarita Geers

## 2016-07-24 NOTE — Progress Notes (Signed)
ANTICOAGULATION CONSULT NOTE - Initial Consult  Pharmacy Consult for heparin Indication: chest pain/ACS  Allergies  Allergen Reactions  . Enalapril Other (See Comments)    Cough     Patient Measurements: Height: 5\' 2"  (157.5 cm) Weight: 173 lb 11.6 oz (78.8 kg) IBW/kg (Calculated) : 50.1 Heparin Dosing Weight: 65kg  Vital Signs: Temp: 98.4 F (36.9 C) (10/26 0726) Temp Source: Oral (10/26 0726) BP: 188/105 (10/26 0822) Pulse Rate: 86 (10/26 0800)  Labs:  Recent Labs  07/23/16 1605 07/24/16 0030 07/24/16 0944  HGB 9.7*  --  10.5*  HCT 28.8*  --  31.3*  PLT 294  --  328  HEPARINUNFRC  --   --  0.52  CREATININE 3.23*  --  3.36*  TROPONINI  --  0.35* 0.35*    Estimated Creatinine Clearance: 16.7 mL/min (by C-G formula based on SCr of 3.36 mg/dL (H)).   Medical History: Past Medical History:  Diagnosis Date  . Arthritis   . Heart murmur   . Hypertension   . Irregular heartbeat   . Liver disease   . Renal insufficiency      Assessment: 65yo female presents w/ N/V, now w/ elevated troponin. On heparin gtt for ACS. First HL therapeutic at 0.52. Hgb ok at 10.5, plts wnl. No s/s of bleed.  Goal of Therapy:  Heparin level 0.3-0.7 units/ml Monitor platelets by anticoagulation protocol: Yes   Plan:  Continue heparin gtt at 900 units/hr Monitor daily HL, CBC, s/s of bleed  Enzo BiNathan Martisha Toulouse, PharmD, [facility] Clinical Pharmacist Pager [phone] 07/24/2016 11:04 AM

## 2016-07-24 NOTE — H&P (Addendum)
[facility] History and Physical  Dessire Grimes ZOX:[id number] DOB: 1951-03-09 DOA: 07/23/2016  Referring physician:  PCP: PROVIDER NOT IN SYSTEM   Chief Complaint: "I've been throwing up."  HPI: Heather Vasquez is a 65 y.o. female with past medical history significant for liver disease, renal insufficiency, high blood pressure, chronic abdominal pain and chronic narcotic use presented to the [facility] [place] with chief complaint of nausea vomiting diarrhea with abdominal pain. Patient's vomiting and diarrhea that started the morning of October 25. She has had lower abdominal pain. She complained of urinary frequency without dysuria. Vomitus and diarrhea were nonbloody. Patient states that she lost her medication has been off of them for a few days. Per patient's dgtr she has had similar symptoms when she lost her medication the past.  Patient endorses fevers, chills, myalgias and back pain  ED Course: blood pressure was very elevated. Patient received Rocephin for UTI. Given doses of labetalol for elevated pressures. Initial plan was patient to be transferred to [facility] but there was a delay and decision was to go to [facility] to the positive troponin came back so patient was to space it to [facility] and cardiology will consult patient went on arrivalb started on nitroglycerin drip. No EKG changes.  Review of Systems:  As per HPI otherwise 10 point review of systems negative.    Past Medical History:  Diagnosis Date  . Arthritis   . Heart murmur   . Hypertension   . Irregular heartbeat   . Liver disease   . Renal insufficiency    Past Surgical History:  Procedure Laterality Date  . ABDOMINAL HYSTERECTOMY    . CESAREAN SECTION    . CHOLECYSTECTOMY    . TUBAL LIGATION     Social History:  reports that she has never smoked. She has never used smokeless tobacco. She reports that she does not drink alcohol or use drugs.  Allergies  Allergen Reactions  .  Enalapril     Cough     No family history on file.   Prior to Admission medications   Medication Sig Start Date End Date Taking? Authorizing Provider  Cholecalciferol (VITAMIN D PO) Take by mouth.   Yes Historical Provider, MD  gabapentin (NEURONTIN) 100 MG capsule Take 100 mg by mouth daily.   Yes Historical Provider, MD  Magnesium Oxide 250 MG TABS Take by mouth.   Yes Historical Provider, MD  oxyCODONE-acetaminophen (PERCOCET) 10-325 MG per tablet Take 1 tablet by mouth every 4 (four) hours as needed for pain.   Yes Historical Provider, MD  spironolactone (ALDACTONE) 25 MG tablet Take 25 mg by mouth daily.   Yes Historical Provider, MD  traZODone (DESYREL) 100 MG tablet Take 100 mg by mouth at bedtime.   Yes Historical Provider, MD  cholestyramine Lanetta Inch) 4 G packet Take 4 g by mouth Nightly.    Historical Provider, MD  Escitalopram Oxalate (LEXAPRO PO) Take by mouth.    Historical Provider, MD  ondansetron (ZOFRAN ODT) 4 MG disintegrating tablet Take 1 tablet (4 mg total) by mouth every 8 (eight) hours as needed for nausea or vomiting. 12/18/14   Teressa Lower, NP  rifaximin (XIFAXAN) 550 MG TABS tablet Take 550 mg by mouth 3 (three) times daily.    Historical Provider, MD   Physical Exam: Vitals:   07/24/16 0620 07/24/16 0645 07/24/16 0655 07/24/16 0700  BP: (!) 204/108 (!) 181/111 (!) 179/104 (!) 161/105  Pulse: 92 (!) 109 100 (!) 103  Resp: 19 (!) 21 (!) 21 (!) 22  Temp: 98.8 F (37.1 C)     TempSrc: Oral     SpO2: 100% 100% 100% 100%  Weight: 78.8 kg (173 lb 11.6 oz)     Height:        Wt Readings from Last 3 Encounters:  07/24/16 78.8 kg (173 lb 11.6 oz)  10/24/13 113.4 kg (250 lb)    General:  Appears calm and comfortable Eyes:  PERRL, EOMI, normal lids, iris ENT:  grossly normal hearing, lips & tongue Neck:  no LAD, masses or thyromegaly Cardiovascular:  RRR, no m/r/g. No LE edema. Nonpitting ankle swelling bilaterally. Respiratory:  CTA bilaterally, no  w/r/r. Normal respiratory effort. Abdomen:  soft, nondistended no guarding no rigidity, suprapubic pain; no CVA tenderness Skin:  no rash or induration seen on limited exam Musculoskeletal:  grossly normal tone BUE/BLE Psychiatric:  grossly normal mood and affect, speech fluent and appropriate Neurologic:  CN 2-12 grossly intact, moves all extremities in coordinated fashion. alert and oriented 3.           Labs on Admission:  Basic Metabolic Panel:  Recent Labs Lab 07/23/16 1605  NA 140  K 3.9  CL 117*  CO2 14*  GLUCOSE 89  BUN 39*  CREATININE 3.23*  CALCIUM 8.8*   Liver Function Tests:  Recent Labs Lab 07/23/16 1605  AST 16  ALT 10*  ALKPHOS 318*  BILITOT 0.4  PROT 7.2  ALBUMIN 4.2    Recent Labs Lab 07/23/16 1605  LIPASE 48   No results for input(s): AMMONIA in the last 168 hours. CBC:  Recent Labs Lab 07/23/16 1605  WBC 9.8  NEUTROABS 8.0*  HGB 9.7*  HCT 28.8*  MCV 96.3  PLT 294   Cardiac Enzymes:  Recent Labs Lab 07/24/16 0030  TROPONINI 0.35*    BNP (last 3 results) No results for input(s): BNP in the last 8760 hours.  ProBNP (last 3 results) No results for input(s): PROBNP in the last 8760 hours.   Serum creatinine: 3.23 mg/dL High 16/10/96 0454 Estimated creatinine clearance: 17.3 mL/min  CBG: No results for input(s): GLUCAP in the last 168 hours.  Radiological Exams on Admission: No results found.  EKG: Independently reviewed. Ventricular rate 107, pr 165, QRS 81, QTC 431; sinus tach, T-wave flattening in V5 and V6-there are no acute ST changes when compared to June 2016 EKG  Assessment/Plan Principal Problem:   Hypertensive emergency Active Problems:   Acute kidney injury (nontraumatic) (HCC)   Nausea vomiting and diarrhea   Chronic abdominal pain   UTI (urinary tract infection)  Hypertensive emergency Blood pressure goal SBP <150 and no CP Continue nitro drip When necessary hydralazine and nitroglycerin  effective Serial troponin Repeat EKG and chest x-ray Cardiology consulted When necessary morphine for chest pain Checking chest x-ray Patient is requiring constant adjustment and monitoring of her nitro drip so will consult critical care and have patient transferred to ICU  UTI Cont rocephin Urine Culture pending  AKI Baseline Cr <2.0, Cr on admit 3.23 2L of normal saline given in the emergency room Gentle hydration, patient already getting fluids for multiple drips Checking magnesium and phosphorus Urine labs ordered to calculate fractional excretion of urea Likely due to large GI losses and infection Checking renal ultrasound Strict I&Os  Nausea vomiting diarrhea Likely medication withdrawal Taking a abdominal film Strict I's and O's Prn zofran  Ankle swelling Holding spironolactone  Chronic pain holding gabapentin, medication was new patient however  take 1 dose  Insomnia Continue trazodone  Chronic abd pain Prn tylenol  Code Status: Full DVT Prophylaxis: Heparin Family Communication: dgtr called Disposition Plan: Pending Improvement    Haydee SalterPhillip M Hobbs, MD Family Medicine [facility] [email or web address] Password [email or web address]

## 2016-07-24 NOTE — Consult Note (Signed)
Cardiology Consult    The patient has been seen in conjunction with Suzzette Righter, NP. All aspects of care have been considered and discussed. The patient has been personally interviewed, examined, and all clinical data has been reviewed.   The patient is cared for in [facility]. Her primary care physician and nephrologist are there.  Troponin I is elevated due to demand and not ACS.  Suggest obtaining a 2-D Doppler echocardiogram to assess systolic function and wall motion. The study will also be helpful to assess for LVH which will give some idea of adequacy of blood pressure control over time.  No ischemic evaluation is recommended.  With CKD, ACE/ARB and spironolactone are contraindicated.  Hypertension/heart failure therapy should be with long-acting nitrates, hydralazine, loop diuretics, and beta blocker. Amlodipine also okay.    Patient ID: Heather Vasquez MRN: [id number], DOB/AGE: [birth year]   Admit date: 07/23/2016 Date of Consult: 07/24/2016  Primary Physician: PROVIDER NOT IN SYSTEM Reason for Consult: Chest pain  Primary Cardiologist: New Requesting Provider: Dr. Melynda Ripple  Patient Profile    Heather Vasquez is a 65 year old female with a past medical history of HTN, chronic renal insufficiency, and liver disease. She presented to [facility] ED on 07/24/16 for nausea, vomiting and abdominal pain that started the day prior. Also in hypertensive crisis after being out of her medications for a week. Found to have elevated troponin, cardiology consulted.   History of Present Illness    Heather Vasquez tells me that she started feeling bad about a week ago with general malaise. She developed nausea and vomiting that worsened today. She presented to the ED with abdominal discomfort. Also was hypertensive with BP of 204/108. She was started on a Nitroglycerin gtt as well as Cardene gtt and transferred to the ICU for hypertensive urgency.   Her troponin was elevated at 0.35, then 9 hours  later was again 0.35. Cardiology was consulted.   She tells me that she did have one episode of chest pain last night that was a sharp stabbing sensation in the center of her chest. It lasted a few seconds and was not associated with SOB or diaphoresis. She has not taken her anti-hypertensives for about a week. She is on Coreg 25mg  BID at home and 25mg  of Spironolactone. These are the only BP meds she takes.   Denies chest pain currently. She has a history of smoking, but quit about 20 years ago. She does have a family history of CAD with her mother having a MI in her 51's.     Past Medical History   Past Medical History:  Diagnosis Date  . Arthritis   . Heart murmur   . Hypertension   . Irregular heartbeat   . Liver disease   . Renal insufficiency     Past Surgical History:  Procedure Laterality Date  . ABDOMINAL HYSTERECTOMY    . CESAREAN SECTION    . CHOLECYSTECTOMY    . TUBAL LIGATION       Allergies  Allergies  Allergen Reactions  . Enalapril Other (See Comments)    Cough     Inpatient Medications    . aspirin EC  325 mg Oral Daily  . cefTRIAXone (ROCEPHIN) IVPB 1 gram/50 mL D5W  1 g Intravenous Q24H  . sodium chloride flush  3 mL Intravenous Q12H  . traZODone  100 mg Oral QHS    Family History    No family history on file.  Social History  Social History   Social History  . Marital status: Married    Spouse name: N/A  . Number of children: N/A  . Years of education: N/A   Occupational History  . Not on file.   Social History Main Topics  . Smoking status: Never Smoker  . Smokeless tobacco: Never Used  . Alcohol use No  . Drug use: No  . Sexual activity: Not on file   Other Topics Concern  . Not on file   Social History Narrative  . No narrative on file     Review of Systems    General:  No chills, fever, night sweats or weight changes.  Cardiovascular:  No chest pain, dyspnea on exertion, edema, orthopnea, palpitations, paroxysmal  nocturnal dyspnea. Dermatological: No rash, lesions/masses Respiratory: No cough, dyspnea Urologic: No hematuria, dysuria Abdominal:   No nausea, vomiting, diarrhea, bright red blood per rectum, melena, or hematemesis Neurologic:  No visual changes, wkns, changes in mental status. All other systems reviewed and are otherwise negative except as noted above.  Physical Exam    Blood pressure (!) 160/90, pulse 99, temperature 99.5 F (37.5 C), temperature source Oral, resp. rate (!) 21, height 5\' 2"  (1.575 m), weight 167 lb 8.8 oz (76 kg), SpO2 99 %.  General: Pleasant, NAD Psych: Normal affect. Neuro: Alert and oriented X 3. Moves all extremities spontaneously. HEENT: Normal  Neck: Supple without bruits or JVD. Lungs:  Resp regular and unlabored, CTA. Heart: RRR no s3, s4, or murmurs. Abdomen: Soft, non-tender, non-distended, BS + x 4.  Extremities: No clubbing, cyanosis or edema. DP/PT/Radials 2+ and equal bilaterally.  Labs     Recent Labs  07/24/16 0030 07/24/16 0944  TROPONINI 0.35* 0.35*   Lab Results  Component Value Date   WBC 11.3 (H) 07/24/2016   HGB 10.5 (L) 07/24/2016   HCT 31.3 (L) 07/24/2016   MCV 94.0 07/24/2016   PLT 328 07/24/2016    Recent Labs Lab 07/24/16 0944  NA 140  K 3.3*  CL 117*  CO2 13*  BUN 34*  CREATININE 3.36*  CALCIUM 8.4*  PROT 6.6  BILITOT 0.7  ALKPHOS 318*  ALT 12*  AST 21  GLUCOSE 144*      Radiology Studies    Portable Chest 1 View  Result Date: 07/24/2016 CLINICAL DATA:  Acute kidney injury EXAM: PORTABLE CHEST 1 VIEW COMPARISON:  04/19/2015 FINDINGS: The heart size and mediastinal contours are within normal limits. Both lungs are clear. The visualized skeletal structures are unremarkable. IMPRESSION: No active disease. Electronically Signed   By: Signa Kell M.D.   On: 07/24/2016 09:28   Dg Abd Portable 1v  Result Date: 07/24/2016 CLINICAL DATA:  Acute kidney injury.  Back pain. EXAM: PORTABLE ABDOMEN - 1 VIEW  COMPARISON:  04/26/2015 FINDINGS: The bowel gas pattern is normal. Large stone in the right kidney is again noted measuring 1.4 cm. IMPRESSION: 1. Nonobstructive bowel gas pattern. 2. Right renal stone. Electronically Signed   By: Signa Kell M.D.   On: 07/24/2016 09:30    EKG & Cardiac Imaging    EKG: NSR with left axis deviation and mild LVH  Echocardiogram: pending   Assessment & Plan    1. Chest pain in the setting of hypertensive urgency: Had a transient episode of chest pain with elevated BP. Pain does not sound typical of ACS but patient has risk factors for CAD.   We will follow Echo to assess for wall motion abnormalities or cardiomyopathy. Right now  with her acute on chronic renal failure (Cr 3.36) and GFR of 16, she is not a cath candidate especially as she is chest pain free and without EKG changes.   Would consider doing Lexiscan Myoview either inpatient or outpatient depending on Echo results.   2. Elevated troponin: Pattern not consistent with ACS. See above discussion.   3. Hypertensive urgency: management per primary team. Would not restart her home Cleda DaubSpiro with renal insufficiency. Would eventually add po hydralazine when she is able to come off gtts.   4. Acute on chronic renal insufficiency: Stable, will follow.    Signed, Little IshikawaErin E Cordaro Mukai, NP 07/24/2016, 12:13 PM Pager: [phone]

## 2016-07-25 DIAGNOSIS — R7989 Other specified abnormal findings of blood chemistry: Secondary | ICD-10-CM

## 2016-07-25 DIAGNOSIS — R778 Other specified abnormalities of plasma proteins: Secondary | ICD-10-CM

## 2016-07-25 LAB — CBC WITH DIFFERENTIAL/PLATELET
Basophils Absolute: 0 10*3/uL (ref 0.0–0.1)
Basophils Relative: 0 %
EOS PCT: 1 %
Eosinophils Absolute: 0.1 10*3/uL (ref 0.0–0.7)
HCT: 28.6 % — ABNORMAL LOW (ref 36.0–46.0)
Hemoglobin: 9.6 g/dL — ABNORMAL LOW (ref 12.0–15.0)
LYMPHS ABS: 1.2 10*3/uL (ref 0.7–4.0)
LYMPHS PCT: 11 %
MCH: 31.4 pg (ref 26.0–34.0)
MCHC: 33.6 g/dL (ref 30.0–36.0)
MCV: 93.5 fL (ref 78.0–100.0)
MONO ABS: 0.8 10*3/uL (ref 0.1–1.0)
MONOS PCT: 8 %
Neutro Abs: 8.7 10*3/uL — ABNORMAL HIGH (ref 1.7–7.7)
Neutrophils Relative %: 80 %
PLATELETS: 294 10*3/uL (ref 150–400)
RBC: 3.06 MIL/uL — AB (ref 3.87–5.11)
RDW: 15.2 % (ref 11.5–15.5)
WBC: 10.8 10*3/uL — ABNORMAL HIGH (ref 4.0–10.5)

## 2016-07-25 LAB — ECHOCARDIOGRAM COMPLETE
EERAT: 7.94
FS: 21 % — AB (ref 28–44)
HEIGHTINCHES: 62 in
IVS/LV PW RATIO, ED: 0.76
LA ID, A-P, ES: 27 mm
LA diam end sys: 27 mm
LA diam index: 1.53 cm/m2
LA vol index: 17.4 mL/m2
LAVOL: 30.8 mL
LAVOLA4C: 25.6 mL
LV E/e' medial: 7.94
LV E/e'average: 7.94
LV PW d: 13.9 mm — AB (ref 0.6–1.1)
LV TDI E'LATERAL: 9.03
LV TDI E'MEDIAL: 6.64
LVELAT: 9.03 cm/s
LVOT area: 3.14 cm2
LVOTD: 20 mm
MV pk A vel: 132 m/s
MV pk E vel: 71.7 m/s
MVPG: 2 mmHg
WEIGHTICAEL: 2680.79 [oz_av]

## 2016-07-25 LAB — HEMOGLOBIN A1C
Hgb A1c MFr Bld: 4.5 % — ABNORMAL LOW (ref 4.8–5.6)
Mean Plasma Glucose: 82 mg/dL

## 2016-07-25 LAB — GASTROINTESTINAL PANEL BY PCR, STOOL (REPLACES STOOL CULTURE)
ASTROVIRUS: NOT DETECTED
Adenovirus F40/41: NOT DETECTED
CRYPTOSPORIDIUM: NOT DETECTED
CYCLOSPORA CAYETANENSIS: NOT DETECTED
Campylobacter species: NOT DETECTED
ENTEROTOXIGENIC E COLI (ETEC): NOT DETECTED
Entamoeba histolytica: NOT DETECTED
Enteroaggregative E coli (EAEC): NOT DETECTED
Enteropathogenic E coli (EPEC): NOT DETECTED
GIARDIA LAMBLIA: NOT DETECTED
NOROVIRUS GI/GII: NOT DETECTED
PLESIMONAS SHIGELLOIDES: NOT DETECTED
Rotavirus A: NOT DETECTED
SALMONELLA SPECIES: NOT DETECTED
SAPOVIRUS (I, II, IV, AND V): NOT DETECTED
Shiga like toxin producing E coli (STEC): NOT DETECTED
Shigella/Enteroinvasive E coli (EIEC): NOT DETECTED
VIBRIO CHOLERAE: NOT DETECTED
Vibrio species: NOT DETECTED
Yersinia enterocolitica: NOT DETECTED

## 2016-07-25 LAB — COMPREHENSIVE METABOLIC PANEL
ALBUMIN: 3.5 g/dL (ref 3.5–5.0)
ALK PHOS: 268 U/L — AB (ref 38–126)
ALT: 15 U/L (ref 14–54)
AST: 22 U/L (ref 15–41)
Anion gap: 11 (ref 5–15)
BILIRUBIN TOTAL: 0.9 mg/dL (ref 0.3–1.2)
BUN: 30 mg/dL — AB (ref 6–20)
CALCIUM: 9.2 mg/dL (ref 8.9–10.3)
CO2: 12 mmol/L — AB (ref 22–32)
CREATININE: 3.22 mg/dL — AB (ref 0.44–1.00)
Chloride: 117 mmol/L — ABNORMAL HIGH (ref 101–111)
GFR calc Af Amer: 17 mL/min — ABNORMAL LOW (ref >60)
GFR calc non Af Amer: 14 mL/min — ABNORMAL LOW (ref >60)
GLUCOSE: 115 mg/dL — AB (ref 65–99)
Potassium: 4.3 mmol/L (ref 3.5–5.1)
SODIUM: 140 mmol/L (ref 135–145)
TOTAL PROTEIN: 6.2 g/dL — AB (ref 6.5–8.1)

## 2016-07-25 LAB — MAGNESIUM: Magnesium: 1.4 mg/dL — ABNORMAL LOW (ref 1.7–2.4)

## 2016-07-25 LAB — C DIFFICILE QUICK SCREEN W PCR REFLEX
C DIFFICILE (CDIFF) INTERP: NOT DETECTED
C DIFFICILE (CDIFF) TOXIN: NEGATIVE
C DIFFICLE (CDIFF) ANTIGEN: NEGATIVE

## 2016-07-25 LAB — PHOSPHORUS: Phosphorus: 4 mg/dL (ref 2.5–4.6)

## 2016-07-25 LAB — HEPARIN LEVEL (UNFRACTIONATED): Heparin Unfractionated: 0.26 IU/mL — ABNORMAL LOW (ref 0.30–0.70)

## 2016-07-25 LAB — UREA NITROGEN, URINE: UREA NITROGEN UR: 279 mg/dL

## 2016-07-25 MED ORDER — AMLODIPINE BESYLATE 10 MG PO TABS
10.0000 mg | ORAL_TABLET | Freq: Every day | ORAL | Status: DC
  Administered 2016-07-25 – 2016-07-29 (×5): 10 mg via ORAL
  Filled 2016-07-25 (×5): qty 1

## 2016-07-25 MED ORDER — HEPARIN SODIUM (PORCINE) 5000 UNIT/ML IJ SOLN
5000.0000 [IU] | Freq: Three times a day (TID) | INTRAMUSCULAR | Status: DC
  Administered 2016-07-25 – 2016-07-29 (×12): 5000 [IU] via SUBCUTANEOUS
  Filled 2016-07-25 (×14): qty 1

## 2016-07-25 MED ORDER — CARVEDILOL 12.5 MG PO TABS
12.5000 mg | ORAL_TABLET | Freq: Two times a day (BID) | ORAL | Status: DC
  Administered 2016-07-25: 12.5 mg via ORAL
  Filled 2016-07-25: qty 1

## 2016-07-25 MED ORDER — ASPIRIN EC 81 MG PO TBEC
81.0000 mg | DELAYED_RELEASE_TABLET | Freq: Every day | ORAL | Status: DC
  Administered 2016-07-26 – 2016-07-29 (×4): 81 mg via ORAL
  Filled 2016-07-25 (×4): qty 1

## 2016-07-25 MED ORDER — AMLODIPINE BESYLATE 5 MG PO TABS
5.0000 mg | ORAL_TABLET | Freq: Every day | ORAL | Status: DC
  Administered 2016-07-25: 5 mg via ORAL
  Filled 2016-07-25: qty 1

## 2016-07-25 MED ORDER — MAGNESIUM SULFATE IN D5W 1-5 GM/100ML-% IV SOLN
1.0000 g | Freq: Once | INTRAVENOUS | Status: AC
  Administered 2016-07-25: 1 g via INTRAVENOUS
  Filled 2016-07-25: qty 100

## 2016-07-25 MED ORDER — FUROSEMIDE 40 MG PO TABS
40.0000 mg | ORAL_TABLET | Freq: Every day | ORAL | Status: DC
  Administered 2016-07-25 – 2016-07-27 (×3): 40 mg via ORAL
  Filled 2016-07-25 (×4): qty 1

## 2016-07-25 MED ORDER — SODIUM CHLORIDE 0.9 % IV SOLN
INTRAVENOUS | Status: DC
  Administered 2016-07-25: 09:00:00 via INTRAVENOUS

## 2016-07-25 MED ORDER — CARVEDILOL 25 MG PO TABS
25.0000 mg | ORAL_TABLET | Freq: Two times a day (BID) | ORAL | Status: DC
  Administered 2016-07-25 – 2016-07-29 (×10): 25 mg via ORAL
  Filled 2016-07-25 (×10): qty 1

## 2016-07-25 NOTE — Progress Notes (Signed)
ANTICOAGULATION CONSULT NOTE   Pharmacy Consult for heparin Indication: chest pain/ACS   Assessment: 65yo female presents w/ N/V, now w/ elevated troponin. On heparin gtt for ACS. First HL therapeutic at 0.5 but follow up level this am is low at 0.26. No IV or bleeding issues noted. Will titrate rate and follow up with level and follow along. No cath is currently planned at this time due to patient's CKD.   Goal of Therapy:  Heparin level 0.3-0.7 units/ml Monitor platelets by anticoagulation protocol: Yes   Plan:  Increase heparin gtt to 1050 units/hr Monitor daily HL, CBC, s/s of bleed   Allergies  Allergen Reactions  . Enalapril Other (See Comments)    Cough     Patient Measurements: Height: 5\' 2"  (157.5 cm) Weight: 167 lb 8.8 oz (76 kg) IBW/kg (Calculated) : 50.1 Heparin Dosing Weight: 65kg  Vital Signs: Temp: 99.9 F (37.7 C) (10/27 0350) Temp Source: Oral (10/27 0350) BP: 183/107 (10/27 0600) Pulse Rate: 109 (10/27 0350)  Labs:  Recent Labs  07/23/16 1605  07/24/16 0944 07/24/16 1437 07/24/16 2112 07/25/16 0255  HGB 9.7*  --  10.5*  --   --  9.6*  HCT 28.8*  --  31.3*  --   --  28.6*  PLT 294  --  328  --   --  294  HEPARINUNFRC  --   --  0.52  --   --  0.26*  CREATININE 3.23*  --  3.36*  --   --  3.22*  TROPONINI  --   < > 0.35* 0.26* 0.30*  --   < > = values in this interval not displayed.  Estimated Creatinine Clearance: 17.1 mL/min (by C-G formula based on SCr of 3.22 mg/dL (H)).   Medical History: Past Medical History:  Diagnosis Date  . Arthritis   . Heart murmur   . Hypertension   . Irregular heartbeat   . Liver disease   . Renal insufficiency    Sheppard CoilFrank Wilson PharmD., BCPS Clinical Pharmacist Pager [phone] 07/25/2016 7:17 AM

## 2016-07-25 NOTE — Progress Notes (Addendum)
[facility] PROGRESS NOTE  Loleta ChanceLorelei Chadderdon ZOX:[id number] DOB: [birth year] DOA: 07/23/2016   PCP: PROVIDER NOT IN SYSTEM     Assessment/Plan: Principal Problem:   Hypertensive emergency Active Problems:   Acute kidney injury (nontraumatic) (HCC)   Nausea vomiting and diarrhea   Chronic abdominal pain   UTI (urinary tract infection)   Chest pain at rest    65 year old female with a past medical history of HTN, chronic renal insufficiency, and liver disease. [facility] for nausea, vomiting and abdominal pain that started the day prior. Also in hypertensive crisis after being out of her medications for a week. Found to have elevated troponin, cardiology consulted. BP of 204/108. She was started on a Nitroglycerin gtt as well as Cardene gtt and transferred to the ICU for hypertensive urgency.   Assessment and plan 1. Hypertensive Emergency: initially Transferred to ICU for management of a chest pain/hypertensive urgency . Goal systolic blood pressure 185-165. Patient started on cardene gtt. negative urine drug screen, normal TSH. Continues to be hypertensive today. Resume Coreg. Taper off cardene gtt. Started norvasc  2. Chest pain/hypertensive urgency, slightly Elevated Troponin I: Cardiology  consulted . Presentation atypical for ACS. Echocardiogram for wall motion abnormalities/cardiomyopathy. No ischemic workup is recommended. Reinstitute antihypertensive therapy and return care to [facility]. IV heparin can be discontinued. Morphine IV when necessary chest pain. Given her acute on chronic renal failure , she is not a candidate for cardiac cath 3. Acute on chronic renal failure stage 3: Baseline appears to be 1.9, now 3.2, Likely due to uncontrolled hypertension. Continuing to trend electrolytes and renal function daily. Renal ultrasound shows bilateral nonobstructing nephrolithiasis .Marland Kitchen.ACE/ARB and spironolactone are  contraindicated, may add lasix per cardiology if renal fn improving  4. Possible UTI: Urine culture from 10/25 pending. Continuing empiric Rocephin. 5. Hypokalemia: Mild. Cautious potassium replacement given acute on chronic renal failure. 6. Hyperglycemia: No history of diabetes mellitus.   hemoglobin A1c 4.5. Monitoring serum glucose with daily labs. 7. Diarrhea: Unclear etiology. Question possible gastroenteritis. Stool studies pending for C. difficile and rotavirus. Continue clear liquid diet 8. H/O Liver Disease: Elevated alkaline phosphatase, unclear etiology, rest of the liver function panel is normal 9. Prophylaxis: Systemic anticoagulation with heparin infusion. No indication for GI prophylaxis at this time. 10. Hypomagnesemia-replete 11. Code Status:  Patient confirms full CODE STATUS per my discussion with her bedside today.     DVT prophylaxsis  Heparin sq  Code Status:  Full code    Family Communication: Discussed in detail with the patient, all imaging results, lab results explained to the patient   Disposition Plan:  Continue stepdown      Consultants:  Cardiology  Pulmonary  Procedures:     Antibiotics: Anti-infectives    Start     Dose/Rate Route Frequency Ordered Stop   07/24/16 1800  cefTRIAXone (ROCEPHIN) 1 g in dextrose 5 % 50 mL IVPB     1 g 100 mL/hr over 30 Minutes Intravenous Every 24 hours 07/24/16 0748     07/24/16 1000  rifaximin (XIFAXAN) tablet 550 mg  Status:  Discontinued     550 mg Oral 3 times daily 07/24/16 0748 07/24/16 0829   07/23/16 1815  cefTRIAXone (ROCEPHIN) 1 g in dextrose 5 % 50 mL IVPB     1 g 100 mL/hr over 30 Minutes Intravenous  Once 07/23/16 1808 07/23/16 1928  HPI/Subjective: Back discomfort and lack of sleep overnight. No chest discomfort or cardiac complaints  Objective: Vitals:   07/25/16 0500 07/25/16 0544 07/25/16 0600 07/25/16 0805  BP: (!) 198/100 (!) 175/90 (!) 183/107   Pulse:    93  Resp: 18  (!) 21 (!) 22 (!) 21  Temp:    99.1 F (37.3 C)  TempSrc:    Oral  SpO2: 100% 100% 99% 100%  Weight:      Height:        Intake/Output Summary (Last 24 hours) at 07/25/16 0811 Last data filed at 07/25/16 0700  Gross per 24 hour  Intake           2168.9 ml  Output              950 ml  Net           1218.9 ml    Exam:  Examination:  General: Pleasant, NAD Psych: Normal affect. Neuro: Alert and oriented X 3. Moves all extremities spontaneously. HEENT: Normal           Neck: Supple without bruits or JVD. Lungs:  Resp regular and unlabored, CTA. Heart: RRR no s3, s4, or murmurs. Abdomen: Soft, non-tender, non-distended, BS + x 4.  Extremities: No clubbing, cyanosis or edema. DP/PT/Radials 2+ and equal bilaterally.   Data Reviewed: I have personally reviewed following labs and imaging studies  Micro Results Recent Results (from the past 240 hour(s))  MRSA PCR Screening     Status: None   Collection Time: 07/24/16  5:45 AM  Result Value Ref Range Status   MRSA by PCR NEGATIVE NEGATIVE Final    Comment:        The GeneXpert MRSA Assay (FDA approved for NASAL specimens only), is one component of a comprehensive MRSA colonization surveillance program. It is not intended to diagnose MRSA infection nor to guide or monitor treatment for MRSA infections.     Radiology Reports US Renal  Result Date: 07/24/2016 CLINICAL DATA:  Hypertension and liver disease EXAM: RENAL / URINARY TRACT ULTRASOUND COMPLETE COMPARISON:  CT abdomen pelvis 04/19/2015 FINDINGS: Right Kidney: Length: 9.4 cm. There is increased echogenicity of the right kidney. Near the lower pole, there is a shadowing calculus measuring 12 mm. No hydronephrosis or solid renal mass. Left Kidney: Length: 9.5 cm. There is increased echogenicity of the left kidney. There is a 7 mm nonobstructive renal calculus seen. There is a renal cyst measuring 2.0 x 1.4 x 1.6 cm. No hydronephrosis. Bladder: Appears normal for degree  of bladder distention. IMPRESSION: 1. Increased echogenicity of both kidneys, suggesting chronic medical renal disease. 2. Bilateral nonobstructing nephrolithiasis with largest stone measuring 1.2 cm. Electronically Signed   By: Deatra Robinson M.D.   On: 07/24/2016 22:52   Portable Chest 1 View  Result Date: 07/24/2016 CLINICAL DATA:  Acute kidney injury EXAM: PORTABLE CHEST 1 VIEW COMPARISON:  04/19/2015 FINDINGS: The heart size and mediastinal contours are within normal limits. Both lungs are clear. The visualized skeletal structures are unremarkable. IMPRESSION: No active disease. Electronically Signed   By: Signa Kell M.D.   On: 07/24/2016 09:28   Dg Abd Portable 1v  Result Date: 07/24/2016 CLINICAL DATA:  Acute kidney injury.  Back pain. EXAM: PORTABLE ABDOMEN - 1 VIEW COMPARISON:  04/26/2015 FINDINGS: The bowel gas pattern is normal. Large stone in the right kidney is again noted measuring 1.4 cm. IMPRESSION: 1. Nonobstructive bowel gas pattern. 2. Right renal stone. Electronically Signed   By:  Signa Kell M.D.   On: 07/24/2016 09:30     CBC  Recent Labs Lab 07/23/16 1605 07/24/16 0944 07/25/16 0255  WBC 9.8 11.3* 10.8*  HGB 9.7* 10.5* 9.6*  HCT 28.8* 31.3* 28.6*  PLT 294 328 294  MCV 96.3 94.0 93.5  MCH 32.4 31.5 31.4  MCHC 33.7 33.5 33.6  RDW 14.5 15.0 15.2  LYMPHSABS 1.4 1.4 1.2  MONOABS 0.4 0.6 0.8  EOSABS 0.0 0.0 0.1  BASOSABS 0.0 0.0 0.0    Chemistries   Recent Labs Lab 07/23/16 1605 07/24/16 0944 07/25/16 0255  NA 140 140 140  K 3.9 3.3* 4.3  CL 117* 117* 117*  CO2 14* 13* 12*  GLUCOSE 89 144* 115*  BUN 39* 34* 30*  CREATININE 3.23* 3.36* 3.22*  CALCIUM 8.8* 8.4* 9.2  MG  --  0.8* 1.4*  AST 16 21 22   ALT 10* 12* 15  ALKPHOS 318* 318* 268*  BILITOT 0.4 0.7 0.9   ------------------------------------------------------------------------------------------------------------------ estimated creatinine clearance is 17.1 mL/min (by C-G formula based  on SCr of 3.22 mg/dL (H)). ------------------------------------------------------------------------------------------------------------------  Recent Labs  07/24/16 1437  HGBA1C 4.5*   ------------------------------------------------------------------------------------------------------------------ No results for input(s): CHOL, HDL, LDLCALC, TRIG, CHOLHDL, LDLDIRECT in the last 72 hours. ------------------------------------------------------------------------------------------------------------------  Recent Labs  07/24/16 1437  TSH 1.023   ------------------------------------------------------------------------------------------------------------------ No results for input(s): VITAMINB12, FOLATE, FERRITIN, TIBC, IRON, RETICCTPCT in the last 72 hours.  Coagulation profile No results for input(s): INR, PROTIME in the last 168 hours.  No results for input(s): DDIMER in the last 72 hours.  Cardiac Enzymes  Recent Labs Lab 07/24/16 0944 07/24/16 1437 07/24/16 2112  TROPONINI 0.35* 0.26* 0.30*   ------------------------------------------------------------------------------------------------------------------ Invalid input(s): POCBNP   CBG: No results for input(s): GLUCAP in the last 168 hours.     Studies: US Renal  Result Date: 07/24/2016 CLINICAL DATA:  Hypertension and liver disease EXAM: RENAL / URINARY TRACT ULTRASOUND COMPLETE COMPARISON:  CT abdomen pelvis 04/19/2015 FINDINGS: Right Kidney: Length: 9.4 cm. There is increased echogenicity of the right kidney. Near the lower pole, there is a shadowing calculus measuring 12 mm. No hydronephrosis or solid renal mass. Left Kidney: Length: 9.5 cm. There is increased echogenicity of the left kidney. There is a 7 mm nonobstructive renal calculus seen. There is a renal cyst measuring 2.0 x 1.4 x 1.6 cm. No hydronephrosis. Bladder: Appears normal for degree of bladder distention. IMPRESSION: 1. Increased echogenicity of both  kidneys, suggesting chronic medical renal disease. 2. Bilateral nonobstructing nephrolithiasis with largest stone measuring 1.2 cm. Electronically Signed   By: Deatra Robinson M.D.   On: 07/24/2016 22:52   Portable Chest 1 View  Result Date: 07/24/2016 CLINICAL DATA:  Acute kidney injury EXAM: PORTABLE CHEST 1 VIEW COMPARISON:  04/19/2015 FINDINGS: The heart size and mediastinal contours are within normal limits. Both lungs are clear. The visualized skeletal structures are unremarkable. IMPRESSION: No active disease. Electronically Signed   By: Signa Kell M.D.   On: 07/24/2016 09:28   Dg Abd Portable 1v  Result Date: 07/24/2016 CLINICAL DATA:  Acute kidney injury.  Back pain. EXAM: PORTABLE ABDOMEN - 1 VIEW COMPARISON:  04/26/2015 FINDINGS: The bowel gas pattern is normal. Large stone in the right kidney is again noted measuring 1.4 cm. IMPRESSION: 1. Nonobstructive bowel gas pattern. 2. Right renal stone. Electronically Signed   By: Signa Kell M.D.   On: 07/24/2016 09:30      Lab Results  Component Value Date   HGBA1C 4.5 (L) 07/24/2016  Lab Results  Component Value Date   CREATININE 3.22 (H) 07/25/2016       Scheduled Meds: . aspirin EC  325 mg Oral Daily  . cefTRIAXone (ROCEPHIN) IVPB 1 gram/50 mL D5W  1 g Intravenous Q24H  . sodium chloride flush  3 mL Intravenous Q12H  . traZODone  100 mg Oral QHS   Continuous Infusions: . heparin 900 Units/hr (07/25/16 0239)  . niCARDipine 5 mg/hr (07/24/16 1640)     LOS: 1 day    Time spent: >30 MINS    [facility]  [facility] Pager [phone]. If 7PM-7AM, please contact night-coverage at [email or web address], password [facility] 07/25/2016, 8:11 AM  LOS: 1 day

## 2016-07-25 NOTE — Progress Notes (Signed)
Patient Name: Heather Vasquez Date of Encounter: 07/25/2016  Primary Cardiologist: [facility] Problem List     Principal Problem:   Hypertensive emergency Active Problems:   Acute kidney injury (nontraumatic) (HCC)   Nausea vomiting and diarrhea   Chronic abdominal pain   UTI (urinary tract infection)   Chest pain at rest     Subjective   Back discomfort and lack of sleep overnight. No chest discomfort or cardiac complaints.  Inpatient Medications    Scheduled Meds: . aspirin EC  325 mg Oral Daily  . carvedilol  12.5 mg Oral BID WC  . cefTRIAXone (ROCEPHIN) IVPB 1 gram/50 mL D5W  1 g Intravenous Q24H  . magnesium sulfate 1 - 4 g bolus IVPB  1 g Intravenous Once  . sodium chloride flush  3 mL Intravenous Q12H  . traZODone  100 mg Oral QHS   Continuous Infusions: . sodium chloride 75 mL/hr at 07/25/16 0839  . heparin 1,050 Units/hr (07/25/16 0720)  . niCARDipine 5 mg/hr (07/24/16 1640)   PRN Meds: acetaminophen **OR** acetaminophen, labetalol, morphine injection, ondansetron (ZOFRAN) IV   Vital Signs    Vitals:   07/25/16 0500 07/25/16 0544 07/25/16 0600 07/25/16 0805  BP: (!) 198/100 (!) 175/90 (!) 183/107   Pulse:    93  Resp: 18 (!) 21 (!) 22 (!) 21  Temp:    99.1 F (37.3 C)  TempSrc:    Oral  SpO2: 100% 100% 99% 100%  Weight:      Height:        Intake/Output Summary (Last 24 hours) at 07/25/16 0857 Last data filed at 07/25/16 0700  Gross per 24 hour  Intake             2150 ml  Output              950 ml  Net             1200 ml   Filed Weights   07/23/16 1448 07/24/16 0620 07/24/16 1130  Weight: 160 lb (72.6 kg) 173 lb 11.6 oz (78.8 kg) 167 lb 8.8 oz (76 kg)    Physical Exam   GEN: Well nourished, well developed, in no acute distress.  HEENT: Grossly normal.  Neck: Supple, no JVD, carotid bruits, or masses. Cardiac: RRR, no murmurs, rubs. There is an S4 gallop. No clubbing, cyanosis, edema.  Radials/DP/PT 2+ and equal  bilaterally.  Respiratory:  Respirations regular and unlabored, clear to auscultation bilaterally. GI: Soft, nontender, nondistended, BS + x 4. MS: no deformity or atrophy. Skin: warm and dry, no rash. Neuro:  Strength and sensation are intact. Psych: AAOx3.  Normal affect.  Labs    CBC  Recent Labs  07/24/16 0944 07/25/16 0255  WBC 11.3* 10.8*  NEUTROABS 9.2* 8.7*  HGB 10.5* 9.6*  HCT 31.3* 28.6*  MCV 94.0 93.5  PLT 328 294   Basic Metabolic Panel  Recent Labs  07/24/16 0944 07/25/16 0255  NA 140 140  K 3.3* 4.3  CL 117* 117*  CO2 13* 12*  GLUCOSE 144* 115*  BUN 34* 30*  CREATININE 3.36* 3.22*  CALCIUM 8.4* 9.2  MG 0.8* 1.4*  PHOS 4.2 4.0   Liver Function Tests  Recent Labs  07/24/16 0944 07/25/16 0255  AST 21 22  ALT 12* 15  ALKPHOS 318* 268*  BILITOT 0.7 0.9  PROT 6.6 6.2*  ALBUMIN 3.7 3.5    Recent Labs  07/23/16 1605  LIPASE 48  Cardiac Enzymes  Recent Labs  07/24/16 0944 07/24/16 1437 07/24/16 2112  TROPONINI 0.35* 0.26* 0.30*   BNP Invalid input(s): POCBNP D-Dimer No results for input(s): DDIMER in the last 72 hours. Hemoglobin A1C  Recent Labs  07/24/16 1437  HGBA1C 4.5*   Fasting Lipid Panel No results for input(s): CHOL, HDL, LDLCALC, TRIG, CHOLHDL, LDLDIRECT in the last 72 hours. Thyroid Function Tests  Recent Labs  07/24/16 1437  TSH 1.023    Telemetry    Sinus rhythm - Personally Reviewed  ECG   Sinus rhythm, biatrial abnormality, left anterior hemiblock. Tracing 07/24/16 8:13 AM  - Personally Reviewed  Radiology    US Renal  Result Date: 07/24/2016 CLINICAL DATA:  Hypertension and liver disease EXAM: RENAL / URINARY TRACT ULTRASOUND COMPLETE COMPARISON:  CT abdomen pelvis 04/19/2015 FINDINGS: Right Kidney: Length: 9.4 cm. There is increased echogenicity of the right kidney. Near the lower pole, there is a shadowing calculus measuring 12 mm. No hydronephrosis or solid renal mass. Left Kidney: Length:  9.5 cm. There is increased echogenicity of the left kidney. There is a 7 mm nonobstructive renal calculus seen. There is a renal cyst measuring 2.0 x 1.4 x 1.6 cm. No hydronephrosis. Bladder: Appears normal for degree of bladder distention. IMPRESSION: 1. Increased echogenicity of both kidneys, suggesting chronic medical renal disease. 2. Bilateral nonobstructing nephrolithiasis with largest stone measuring 1.2 cm. Electronically Signed   By: Deatra Robinson M.D.   On: 07/24/2016 22:52   Portable Chest 1 View  Result Date: 07/24/2016 CLINICAL DATA:  Acute kidney injury EXAM: PORTABLE CHEST 1 VIEW COMPARISON:  04/19/2015 FINDINGS: The heart size and mediastinal contours are within normal limits. Both lungs are clear. The visualized skeletal structures are unremarkable. IMPRESSION: No active disease. Electronically Signed   By: Signa Kell M.D.   On: 07/24/2016 09:28   Dg Abd Portable 1v  Result Date: 07/24/2016 CLINICAL DATA:  Acute kidney injury.  Back pain. EXAM: PORTABLE ABDOMEN - 1 VIEW COMPARISON:  04/26/2015 FINDINGS: The bowel gas pattern is normal. Large stone in the right kidney is again noted measuring 1.4 cm. IMPRESSION: 1. Nonobstructive bowel gas pattern. 2. Right renal stone. Electronically Signed   By: Signa Kell M.D.   On: 07/24/2016 09:30    Cardiac Studies   Echocardiogram is pending.  Patient Profile     65 year old CAD for predominantly in [facility] with hypertension, chronic kidney disease stage IV, and chronic liver disease. On presentation there was hypertensive urgency. Medication noncompliance for at least 7 days was admitted. Elevated troponin was noted. No chest discomfort or EKG evidence of ischemia.  Assessment & Plan    1. Elevated troponin secondary to demand. This is not a clinical acute coronary syndrome. No ischemic workup is recommended. Reinstitute antihypertensive therapy and return care to her primary physicians in [facility]. IV  heparin can be discontinued. DVT prophylaxis is recommended since she has predominantly immobile. 2. Severe hypertension, significantly improved after reinstitution of medical therapy. Her blood pressures predominantly managed by nephrology. It would be nice to discuss blood pressure management with that physician. My recommendation is reinstitution of beta blocker therapy back to the level previously taken. Consider adding amlodipine if significant blood pressure elevation. It seems unreasonable to use spironolactone in this setting with creatinine greater than 3. Recommend starting a loop diuretic, perhaps furosemide 40 mg daily.  We will sign off. Please let us know if we can provide further assistance.  Signed, Lyn Records III,  MD  07/25/2016, 8:57 AM

## 2016-07-26 DIAGNOSIS — N3000 Acute cystitis without hematuria: Secondary | ICD-10-CM

## 2016-07-26 LAB — CBC
HEMATOCRIT: 26.5 % — AB (ref 36.0–46.0)
HEMOGLOBIN: 8.8 g/dL — AB (ref 12.0–15.0)
MCH: 31.1 pg (ref 26.0–34.0)
MCHC: 33.2 g/dL (ref 30.0–36.0)
MCV: 93.6 fL (ref 78.0–100.0)
Platelets: 267 10*3/uL (ref 150–400)
RBC: 2.83 MIL/uL — ABNORMAL LOW (ref 3.87–5.11)
RDW: 15 % (ref 11.5–15.5)
WBC: 9.9 10*3/uL (ref 4.0–10.5)

## 2016-07-26 LAB — COMPREHENSIVE METABOLIC PANEL
ALBUMIN: 3.2 g/dL — AB (ref 3.5–5.0)
ALK PHOS: 261 U/L — AB (ref 38–126)
ALT: 17 U/L (ref 14–54)
ANION GAP: 8 (ref 5–15)
AST: 19 U/L (ref 15–41)
BILIRUBIN TOTAL: 0.5 mg/dL (ref 0.3–1.2)
BUN: 28 mg/dL — AB (ref 6–20)
CALCIUM: 9.5 mg/dL (ref 8.9–10.3)
CO2: 13 mmol/L — AB (ref 22–32)
Chloride: 118 mmol/L — ABNORMAL HIGH (ref 101–111)
Creatinine, Ser: 3.32 mg/dL — ABNORMAL HIGH (ref 0.44–1.00)
GFR calc Af Amer: 16 mL/min — ABNORMAL LOW (ref >60)
GFR calc non Af Amer: 14 mL/min — ABNORMAL LOW (ref >60)
GLUCOSE: 111 mg/dL — AB (ref 65–99)
Potassium: 4.2 mmol/L (ref 3.5–5.1)
SODIUM: 139 mmol/L (ref 135–145)
TOTAL PROTEIN: 5.8 g/dL — AB (ref 6.5–8.1)

## 2016-07-26 LAB — URINE CULTURE: Culture: >=100000 — AB

## 2016-07-26 LAB — MAGNESIUM: Magnesium: 1.4 mg/dL — ABNORMAL LOW (ref 1.7–2.4)

## 2016-07-26 MED ORDER — MAGNESIUM SULFATE IN D5W 1-5 GM/100ML-% IV SOLN
1.0000 g | Freq: Once | INTRAVENOUS | Status: AC
  Administered 2016-07-26: 1 g via INTRAVENOUS
  Filled 2016-07-26: qty 100

## 2016-07-26 MED ORDER — SODIUM BICARBONATE 650 MG PO TABS
1300.0000 mg | ORAL_TABLET | Freq: Three times a day (TID) | ORAL | Status: DC
  Administered 2016-07-26 – 2016-07-29 (×9): 1300 mg via ORAL
  Filled 2016-07-26 (×9): qty 2

## 2016-07-26 NOTE — Progress Notes (Signed)
[facility] PROGRESS NOTE  Heather ChanceLorelei Vasquez GNF:[id number] DOB: 1951/09/04 DOA: 07/23/2016   PCP: PROVIDER NOT IN SYSTEM     Assessment/Plan: Principal Problem:   Hypertensive emergency Active Problems:   Acute kidney injury (nontraumatic) (HCC)   Nausea vomiting and diarrhea   Chronic abdominal pain   UTI (urinary tract infection)   Chest pain at rest   Elevated troponin    65 year old female with a past medical history of HTN, chronic renal insufficiency, and liver disease. [facility] for nausea, vomiting and abdominal pain that started the day prior. Also in hypertensive crisis after being out of her medications for a week. Found to have elevated troponin, cardiology consulted. BP of 204/108. She was started on a Nitroglycerin gtt as well as Cardene gtt and transferred to the ICU for hypertensive urgency. After stabilization, transferred to telemetry. Cardiology seen and signed off.  Assessment and plan 1. Hypertensive Emergency: Transferred to ICU for management of a chest pain/hypertensive urgency . Goal systolic blood pressure 185-165. Patient started on cardene gtt. negative urine drug screen, normal TSH. Resumed Coreg. Taper off cardene gtt. Started norvasc. Much improved blood pressure control suspicious for noncompliance. Patient counseled extensively regarding importance of compliance with all aspects of medical care and she verbalized understanding.  2. Chest pain/elevated troponin, slightly Elevated Troponin I: Cardiology  consulted . As per cardiology input, elevated troponin secondary to demand and this is not a clinical acute coronary syndrome. No ischemic workup recommended. IV heparin which was started was discontinued. 3. Acute on chronic renal failure: Baseline appears to be 1.9, now 3.2, Likely due to uncontrolled hypertension. Continuing to trend electrolytes and renal function daily. Renal ultrasound shows bilateral  nonobstructing nephrolithiasis .ACE/ARB and spironolactone are contraindicated, may add lasix per cardiology if renal fn improving.  as per patient, she follows with [facility]. Leo [facility] ,[facility] and last visit was 2 weeks ago when apparently her creatinine was in the 3-4 range and was informed about need for possible kidney biopsy. Current creatinine may be her baseline.  4. Klebsiella UTI: Urine culture from 10/25 pending. Klebsiella UTI. Completed 4 days of sensitive ceftriaxone-discontinue. 5. Hypokalemia: Mild. Cautious potassium replacement given acute on chronic renal failure. 6. Hyperglycemia: No history of diabetes mellitus.   hemoglobin A1c 4.5. Monitoring serum glucose with daily labs. 7. Diarrhea: Unclear etiology. Question possible gastroenteritis. C. difficile testing negative. Tolerating liquid diet. Advance to regular consistency diet. As per patient, after colon surgery last year, she has been having intermittent episodes of diarrhea up to 3 times per year and this is similar to those episodes.  8. H/O Liver Disease: Elevated alkaline phosphatase, unclear etiology, rest of the liver function panel is normal. Patient indicates that she is told to have cirrhosis of unclear etiology.  9. Hypomagnesemia-replete cautiously given chronic kidney disease and follow BMP. 10. Non-anion gap metabolic acidosis: Likely secondary to diarrhea complicating chronic kidney disease. Discussed with nephrologist on call on 10/28 who recommended adding sodium bicarbonate 1300 mg 3 times a day.       DVT prophylaxsis  Heparin sq  Code Status:  Full code    Family Communication: Discussed in detail with the patient, all imaging results, lab results explained to the patient   Disposition Plan: DC home when medically stable.  Consultants:  Cardiology  Pulmonary  Procedures:     Antibiotics: Anti-infectives    Start     Dose/Rate Route  Frequency Ordered Stop   07/24/16 1800   cefTRIAXone (ROCEPHIN) 1 g in dextrose 5 % 50 mL IVPB     1 g 100 mL/hr over 30 Minutes Intravenous Every 24 hours 07/24/16 0748     07/24/16 1000  rifaximin (XIFAXAN) tablet 550 mg  Status:  Discontinued     550 mg Oral 3 times daily 07/24/16 0748 07/24/16 0829   07/23/16 1815  cefTRIAXone (ROCEPHIN) 1 g in dextrose 5 % 50 mL IVPB     1 g 100 mL/hr over 30 Minutes Intravenous  Once 07/23/16 1808 07/23/16 1928         HPI/Subjective: No nausea or vomiting. Diarrhea improved. Requesting regular consistency diet. No pain issues.   Objective: Vitals:   07/26/16 0528 07/26/16 1005 07/26/16 1429 07/26/16 1713  BP: 101/60 (!) 160/80 129/61 (!) 142/69  Pulse: 94 85 68 74  Resp: 19  19   Temp: 98.7 F (37.1 C)  99.1 F (37.3 C)   TempSrc: Oral  Oral   SpO2: 95%  100%   Weight: 131.1 kg (289 lb)     Height:        Intake/Output Summary (Last 24 hours) at 07/26/16 1808 Last data filed at 07/26/16 1740  Gross per 24 hour  Intake             1044 ml  Output                0 ml  Net             1044 ml    Exam:  Examination:  General: Pleasant, NAD Psych: Normal affect. Neuro: Alert and oriented X 3. Moves all extremities spontaneously. HEENT: Normal           Neck: Supple without bruits or JVD. Lungs:  Resp regular and unlabored, CTA. Heart: RRR no s3, s4, or murmurs.Telemetry: Sinus rhythm.  Abdomen: Soft, non-tender, non-distended, BS + x 4.  Extremities: No clubbing, cyanosis or edema. DP/PT/Radials 2+ and equal bilaterally.   Data Reviewed: I have personally reviewed following labs and imaging studies  Micro Results Recent Results (from the past 240 hour(s))  Urine culture     Status: Abnormal   Collection Time: 07/23/16  5:00 PM  Result Value Ref Range Status   Specimen Description URINE, RANDOM  Final   Special Requests NONE  Final   Culture >=100,000 COLONIES/mL KLEBSIELLA PNEUMONIAE (A)  Final   Report Status 07/26/2016 FINAL  Final   Organism ID,  Bacteria KLEBSIELLA PNEUMONIAE (A)  Final      Susceptibility   Klebsiella pneumoniae - MIC*    AMPICILLIN <=2 RESISTANT Resistant     CEFAZOLIN <=4 SENSITIVE Sensitive     CEFTRIAXONE <=1 SENSITIVE Sensitive     CIPROFLOXACIN <=0.25 SENSITIVE Sensitive     GENTAMICIN <=1 SENSITIVE Sensitive     IMIPENEM <=0.25 SENSITIVE Sensitive     NITROFURANTOIN <=16 SENSITIVE Sensitive     TRIMETH/SULFA <=20 SENSITIVE Sensitive     AMPICILLIN/SULBACTAM <=2 SENSITIVE Sensitive     PIP/TAZO <=4 SENSITIVE Sensitive     Extended ESBL NEGATIVE Sensitive     * >=100,000 COLONIES/mL KLEBSIELLA PNEUMONIAE  MRSA PCR Screening     Status: None   Collection Time: 07/24/16  5:45 AM  Result Value Ref Range Status   MRSA by PCR NEGATIVE NEGATIVE Final    Comment:        The GeneXpert MRSA Assay (FDA approved for NASAL specimens only), is one  component of a comprehensive MRSA colonization surveillance program. It is not intended to diagnose MRSA infection nor to guide or monitor treatment for MRSA infections.   Gastrointestinal Panel by PCR , Stool     Status: None   Collection Time: 07/25/16  8:29 AM  Result Value Ref Range Status   Campylobacter species NOT DETECTED NOT DETECTED Final   Plesimonas shigelloides NOT DETECTED NOT DETECTED Final   Salmonella species NOT DETECTED NOT DETECTED Final   Yersinia enterocolitica NOT DETECTED NOT DETECTED Final   Vibrio species NOT DETECTED NOT DETECTED Final   Vibrio cholerae NOT DETECTED NOT DETECTED Final   Enteroaggregative E coli (EAEC) NOT DETECTED NOT DETECTED Final   Enteropathogenic E coli (EPEC) NOT DETECTED NOT DETECTED Final   Enterotoxigenic E coli (ETEC) NOT DETECTED NOT DETECTED Final   Shiga like toxin producing E coli (STEC) NOT DETECTED NOT DETECTED Final   Shigella/Enteroinvasive E coli (EIEC) NOT DETECTED NOT DETECTED Final   Cryptosporidium NOT DETECTED NOT DETECTED Final   Cyclospora cayetanensis NOT DETECTED NOT DETECTED Final    Entamoeba histolytica NOT DETECTED NOT DETECTED Final   Giardia lamblia NOT DETECTED NOT DETECTED Final   Adenovirus F40/41 NOT DETECTED NOT DETECTED Final   Astrovirus NOT DETECTED NOT DETECTED Final   Norovirus GI/GII NOT DETECTED NOT DETECTED Final   Rotavirus A NOT DETECTED NOT DETECTED Final   Sapovirus (I, II, IV, and V) NOT DETECTED NOT DETECTED Final  C difficile quick scan w PCR reflex     Status: None   Collection Time: 07/25/16  8:29 AM  Result Value Ref Range Status   C Diff antigen NEGATIVE NEGATIVE Final   C Diff toxin NEGATIVE NEGATIVE Final   C Diff interpretation No C. difficile detected.  Final    Radiology Reports US Renal  Result Date: 07/24/2016 CLINICAL DATA:  Hypertension and liver disease EXAM: RENAL / URINARY TRACT ULTRASOUND COMPLETE COMPARISON:  CT abdomen pelvis 04/19/2015 FINDINGS: Right Kidney: Length: 9.4 cm. There is increased echogenicity of the right kidney. Near the lower pole, there is a shadowing calculus measuring 12 mm. No hydronephrosis or solid renal mass. Left Kidney: Length: 9.5 cm. There is increased echogenicity of the left kidney. There is a 7 mm nonobstructive renal calculus seen. There is a renal cyst measuring 2.0 x 1.4 x 1.6 cm. No hydronephrosis. Bladder: Appears normal for degree of bladder distention. IMPRESSION: 1. Increased echogenicity of both kidneys, suggesting chronic medical renal disease. 2. Bilateral nonobstructing nephrolithiasis with largest stone measuring 1.2 cm. Electronically Signed   By: Deatra Robinson M.D.   On: 07/24/2016 22:52   Portable Chest 1 View  Result Date: 07/24/2016 CLINICAL DATA:  Acute kidney injury EXAM: PORTABLE CHEST 1 VIEW COMPARISON:  04/19/2015 FINDINGS: The heart size and mediastinal contours are within normal limits. Both lungs are clear. The visualized skeletal structures are unremarkable. IMPRESSION: No active disease. Electronically Signed   By: Signa Kell M.D.   On: 07/24/2016 09:28   Dg Abd  Portable 1v  Result Date: 07/24/2016 CLINICAL DATA:  Acute kidney injury.  Back pain. EXAM: PORTABLE ABDOMEN - 1 VIEW COMPARISON:  04/26/2015 FINDINGS: The bowel gas pattern is normal. Large stone in the right kidney is again noted measuring 1.4 cm. IMPRESSION: 1. Nonobstructive bowel gas pattern. 2. Right renal stone. Electronically Signed   By: Signa Kell M.D.   On: 07/24/2016 09:30     CBC  Recent Labs Lab 07/23/16 1605 07/24/16 0944 07/25/16 0255 07/26/16 1610  WBC 9.8 11.3* 10.8* 9.9  HGB 9.7* 10.5* 9.6* 8.8*  HCT 28.8* 31.3* 28.6* 26.5*  PLT 294 328 294 267  MCV 96.3 94.0 93.5 93.6  MCH 32.4 31.5 31.4 31.1  MCHC 33.7 33.5 33.6 33.2  RDW 14.5 15.0 15.2 15.0  LYMPHSABS 1.4 1.4 1.2  --   MONOABS 0.4 0.6 0.8  --   EOSABS 0.0 0.0 0.1  --   BASOSABS 0.0 0.0 0.0  --     Chemistries   Recent Labs Lab 07/23/16 1605 07/24/16 0944 07/25/16 0255 07/26/16 0226  NA 140 140 140 139  K 3.9 3.3* 4.3 4.2  CL 117* 117* 117* 118*  CO2 14* 13* 12* 13*  GLUCOSE 89 144* 115* 111*  BUN 39* 34* 30* 28*  CREATININE 3.23* 3.36* 3.22* 3.32*  CALCIUM 8.8* 8.4* 9.2 9.5  MG  --  0.8* 1.4* 1.4*  AST 16 21 22 19   ALT 10* 12* 15 17  ALKPHOS 318* 318* 268* 261*  BILITOT 0.4 0.7 0.9 0.5   ------------------------------------------------------------------------------------------------------------------ estimated creatinine clearance is 22.6 mL/min (by C-G formula based on SCr of 3.32 mg/dL (H)). ------------------------------------------------------------------------------------------------------------------  Recent Labs  07/24/16 1437  HGBA1C 4.5*   ------------------------------------------------------------------------------------------------------------------ No results for input(s): CHOL, HDL, LDLCALC, TRIG, CHOLHDL, LDLDIRECT in the last 72 hours. ------------------------------------------------------------------------------------------------------------------  Recent Labs   07/24/16 1437  TSH 1.023   ------------------------------------------------------------------------------------------------------------------ No results for input(s): VITAMINB12, FOLATE, FERRITIN, TIBC, IRON, RETICCTPCT in the last 72 hours.  Coagulation profile No results for input(s): INR, PROTIME in the last 168 hours.  No results for input(s): DDIMER in the last 72 hours.  Cardiac Enzymes  Recent Labs Lab 07/24/16 0944 07/24/16 1437 07/24/16 2112  TROPONINI 0.35* 0.26* 0.30*   ------------------------------------------------------------------------------------------------------------------ Invalid input(s): POCBNP   CBG: No results for input(s): GLUCAP in the last 168 hours.         Lab Results  Component Value Date   HGBA1C 4.5 (L) 07/24/2016   Lab Results  Component Value Date   CREATININE 3.32 (H) 07/26/2016       Scheduled Meds: . amLODipine  10 mg Oral Daily  . aspirin EC  81 mg Oral Daily  . carvedilol  25 mg Oral BID WC  . cefTRIAXone (ROCEPHIN) IVPB 1 gram/50 mL D5W  1 g Intravenous Q24H  . furosemide  40 mg Oral Daily  . heparin subcutaneous  5,000 Units Subcutaneous Q8H  . sodium chloride flush  3 mL Intravenous Q12H  . traZODone  100 mg Oral QHS   Continuous Infusions:     LOS: 2 days    Jahmiya Guidotti, MD, FACP, FHM. [facility] Pager [phone]  If 7PM-7AM, please contact night-coverage [email or web address] Password [email or web address] 07/26/2016, 6:18 PM

## 2016-07-27 LAB — BASIC METABOLIC PANEL
ANION GAP: 7 (ref 5–15)
BUN: 29 mg/dL — AB (ref 6–20)
CALCIUM: 9.1 mg/dL (ref 8.9–10.3)
CO2: 13 mmol/L — ABNORMAL LOW (ref 22–32)
Chloride: 117 mmol/L — ABNORMAL HIGH (ref 101–111)
Creatinine, Ser: 3.32 mg/dL — ABNORMAL HIGH (ref 0.44–1.00)
GFR calc Af Amer: 16 mL/min — ABNORMAL LOW (ref >60)
GFR, EST NON AFRICAN AMERICAN: 14 mL/min — AB (ref >60)
Glucose, Bld: 103 mg/dL — ABNORMAL HIGH (ref 65–99)
POTASSIUM: 3.7 mmol/L (ref 3.5–5.1)
SODIUM: 137 mmol/L (ref 135–145)

## 2016-07-27 LAB — CBC
HEMATOCRIT: 25.4 % — AB (ref 36.0–46.0)
Hemoglobin: 8.4 g/dL — ABNORMAL LOW (ref 12.0–15.0)
MCH: 31.2 pg (ref 26.0–34.0)
MCHC: 33.1 g/dL (ref 30.0–36.0)
MCV: 94.4 fL (ref 78.0–100.0)
Platelets: 272 10*3/uL (ref 150–400)
RBC: 2.69 MIL/uL — ABNORMAL LOW (ref 3.87–5.11)
RDW: 14.8 % (ref 11.5–15.5)
WBC: 8 10*3/uL (ref 4.0–10.5)

## 2016-07-27 LAB — MAGNESIUM: MAGNESIUM: 1.5 mg/dL — AB (ref 1.7–2.4)

## 2016-07-27 MED ORDER — LOPERAMIDE HCL 2 MG PO CAPS
2.0000 mg | ORAL_CAPSULE | Freq: Two times a day (BID) | ORAL | Status: DC | PRN
  Administered 2016-07-27: 2 mg via ORAL
  Filled 2016-07-27: qty 1

## 2016-07-27 MED ORDER — MAGNESIUM SULFATE 2 GM/50ML IV SOLN
2.0000 g | Freq: Once | INTRAVENOUS | Status: AC
  Administered 2016-07-27: 2 g via INTRAVENOUS
  Filled 2016-07-27: qty 50

## 2016-07-27 NOTE — Progress Notes (Signed)
Patient with loose stools given imodium as ordered as needed for loose stools will continue to monitor patient. Jameson Tormey, Randall AnKristin Jessup RN

## 2016-07-27 NOTE — Progress Notes (Signed)
[facility] PROGRESS NOTE  Brianah Hopson ZOX:[id number] DOB: 11/25/1950 DOA: 07/23/2016   PCP: PROVIDER NOT IN SYSTEM     Assessment/Plan: Principal Problem:   Hypertensive emergency Active Problems:   Acute kidney injury (nontraumatic) (HCC)   Nausea vomiting and diarrhea   Chronic abdominal pain   UTI (urinary tract infection)   Chest pain at rest   Elevated troponin    65 year old female with a past medical history of HTN, chronic renal insufficiency, and liver disease. She presented to [facility] ED on 07/24/16 for nausea, vomiting and abdominal pain that started the day prior. Also in hypertensive crisis after being out of her medications for a week. Found to have elevated troponin, cardiology consulted. BP of 204/108. She was started on a Nitroglycerin gtt as well as Cardene gtt and transferred to the ICU for hypertensive urgency. After stabilization, transferred to telemetry. Cardiology seen and signed off.  Assessment and plan 1. Hypertensive Emergency: Transferred to ICU for management of a chest pain/hypertensive urgency . Goal systolic blood pressure 185-165. Patient started on cardene gtt. negative urine drug screen, normal TSH. Resumed Coreg. Tapered off cardene gtt. Started norvasc. Much improved blood pressure control, suspicious for noncompliance pta. Patient counseled extensively regarding importance of compliance with all aspects of medical care and she verbalized understanding.  2. Chest pain/elevated troponin, slightly Elevated Troponin I: Cardiology  consulted . As per cardiology input, elevated troponin secondary to demand and this is not a clinical acute coronary syndrome. No ischemic workup recommended. IV heparin which was started was discontinued. 3. Acute on chronic renal failure: Baseline appears to be 1.9, now 3.2, Likely due to uncontrolled hypertension. Continuing to trend electrolytes and renal function daily. Renal ultrasound shows bilateral  nonobstructing nephrolithiasis .ACE/ARB and spironolactone are contraindicated, may add lasix per cardiology if renal fn improving. As per patient, she follows with Dr. Leo Rod ,nephrologist in [facility] and last visit was 2 weeks ago when apparently her creatinine was in the 3-4 range and was informed about need for possible kidney biopsy. Current creatinine may be her baseline. Recommend discussing with her primary nephrologist on 10/30, prior to possible discharge. 4. Klebsiella UTI: Urine culture from 10/25 pending. Klebsiella UTI. Completed 4 days of sensitive ceftriaxone-discontinued. 5. Hypokalemia: Mild/replaced 6. Hyperglycemia: No history of diabetes mellitus. Hemoglobin A1c 4.5. Monitoring serum glucose with daily labs. 7. Diarrhea: Unclear etiology. Question possible gastroenteritis. C. difficile & GI pathogen panel testing negative. Tolerating liquid diet. Advanced to regular consistency diet. As per patient, after colon surgery last year, she has been having intermittent episodes of diarrhea up to 3 times per year and this is similar to those episodes. However diarrhea has lasted much longer this time than prior episodes. Trial of Imodium. Had up to 6 loose BMs in the last 24 hours 8. H/O Liver Disease: Elevated alkaline phosphatase, unclear etiology, rest of the liver function panel is normal. Patient indicates that she is told to have cirrhosis of unclear etiology.  9. Hypomagnesemia-replete cautiously given chronic kidney disease and follow BMP. 10. Non-anion gap metabolic acidosis: Likely secondary to diarrhea complicating chronic kidney disease. Discussed with nephrologist on call on 10/28 who recommended adding sodium bicarbonate 1300 mg 3 times a day.  11. Anemia: Probably related to chronic kidney disease and chronic disease. Hemoglobin gradually dropping. Follow CBC in a.m.      DVT prophylaxsis  Heparin sq  Code Status:  Full code    Family Communication: Discussed in  detail with the patient, all imaging results, lab results explained to the patient   Disposition Plan: DC home when medically stable, possibly 10/30 pending improvement in diarrhea and stability of her metabolic panel.  Consultants:  Cardiology  Pulmonary  Procedures:     Antibiotics: Anti-infectives    Start     Dose/Rate Route Frequency Ordered Stop   07/24/16 1800  cefTRIAXone (ROCEPHIN) 1 g in dextrose 5 % 50 mL IVPB  Status:  Discontinued     1 g 100 mL/hr over 30 Minutes Intravenous Every 24 hours 07/24/16 0748 07/26/16 1820   07/24/16 1000  rifaximin (XIFAXAN) tablet 550 mg  Status:  Discontinued     550 mg Oral 3 times daily 07/24/16 0748 07/24/16 0829   07/23/16 1815  cefTRIAXone (ROCEPHIN) 1 g in dextrose 5 % 50 mL IVPB     1 g 100 mL/hr over 30 Minutes Intravenous  Once 07/23/16 1808 07/23/16 1928         HPI/Subjective: Tolerating regular consistency diet. Had up to 6 episodes of loose BMs in the last 24 hours and indicates that her diarrhea has lasted longer than prior episodes which is unusual. No abdominal pain. No nausea or vomiting.  Objective: Vitals:   07/26/16 1713 07/26/16 2009 07/27/16 0544 07/27/16 1003  BP: (!) 142/69 130/67 136/73 132/72  Pulse: 74 81 78 75  Resp:  18 18   Temp:  99.5 F (37.5 C) 99 F (37.2 C)   TempSrc:  Oral Oral   SpO2:  98% 100%   Weight:      Height:        Intake/Output Summary (Last 24 hours) at 07/27/16 1445 Last data filed at 07/27/16 1230  Gross per 24 hour  Intake              822 ml  Output                0 ml  Net              822 ml    Exam:  Examination:  General: Pleasant, NAD Psych: Normal affect. Neuro: Alert and oriented X 3. Moves all extremities spontaneously. HEENT: Normal           Neck: Supple without bruits or JVD. Lungs:  Resp regular and unlabored, CTA. Heart: RRR no s3, s4, or murmurs.Telemetry: Sinus rhythm.  Abdomen: Soft, non-tender, non-distended, BS + x 4.  Extremities: No  clubbing, cyanosis or edema. DP/PT/Radials 2+ and equal bilaterally.   Data Reviewed: I have personally reviewed following labs and imaging studies  Micro Results Recent Results (from the past 240 hour(s))  Urine culture     Status: Abnormal   Collection Time: 07/23/16  5:00 PM  Result Value Ref Range Status   Specimen Description URINE, RANDOM  Final   Special Requests NONE  Final   Culture >=100,000 COLONIES/mL KLEBSIELLA PNEUMONIAE (A)  Final   Report Status 07/26/2016 FINAL  Final   Organism ID, Bacteria KLEBSIELLA PNEUMONIAE (A)  Final      Susceptibility   Klebsiella pneumoniae - MIC*    AMPICILLIN <=2 RESISTANT Resistant     CEFAZOLIN <=4 SENSITIVE Sensitive     CEFTRIAXONE <=1 SENSITIVE Sensitive     CIPROFLOXACIN <=0.25 SENSITIVE Sensitive     GENTAMICIN <=1 SENSITIVE Sensitive     IMIPENEM <=0.25 SENSITIVE Sensitive     NITROFURANTOIN <=16 SENSITIVE Sensitive     TRIMETH/SULFA <=20 SENSITIVE Sensitive     AMPICILLIN/SULBACTAM <=  2 SENSITIVE Sensitive     PIP/TAZO <=4 SENSITIVE Sensitive     Extended ESBL NEGATIVE Sensitive     * >=100,000 COLONIES/mL KLEBSIELLA PNEUMONIAE  MRSA PCR Screening     Status: None   Collection Time: 07/24/16  5:45 AM  Result Value Ref Range Status   MRSA by PCR NEGATIVE NEGATIVE Final    Comment:        The GeneXpert MRSA Assay (FDA approved for NASAL specimens only), is one component of a comprehensive MRSA colonization surveillance program. It is not intended to diagnose MRSA infection nor to guide or monitor treatment for MRSA infections.   Gastrointestinal Panel by PCR , Stool     Status: None   Collection Time: 07/25/16  8:29 AM  Result Value Ref Range Status   Campylobacter species NOT DETECTED NOT DETECTED Final   Plesimonas shigelloides NOT DETECTED NOT DETECTED Final   Salmonella species NOT DETECTED NOT DETECTED Final   Yersinia enterocolitica NOT DETECTED NOT DETECTED Final   Vibrio species NOT DETECTED NOT DETECTED  Final   Vibrio cholerae NOT DETECTED NOT DETECTED Final   Enteroaggregative E coli (EAEC) NOT DETECTED NOT DETECTED Final   Enteropathogenic E coli (EPEC) NOT DETECTED NOT DETECTED Final   Enterotoxigenic E coli (ETEC) NOT DETECTED NOT DETECTED Final   Shiga like toxin producing E coli (STEC) NOT DETECTED NOT DETECTED Final   Shigella/Enteroinvasive E coli (EIEC) NOT DETECTED NOT DETECTED Final   Cryptosporidium NOT DETECTED NOT DETECTED Final   Cyclospora cayetanensis NOT DETECTED NOT DETECTED Final   Entamoeba histolytica NOT DETECTED NOT DETECTED Final   Giardia lamblia NOT DETECTED NOT DETECTED Final   Adenovirus F40/41 NOT DETECTED NOT DETECTED Final   Astrovirus NOT DETECTED NOT DETECTED Final   Norovirus GI/GII NOT DETECTED NOT DETECTED Final   Rotavirus A NOT DETECTED NOT DETECTED Final   Sapovirus (I, II, IV, and V) NOT DETECTED NOT DETECTED Final  C difficile quick scan w PCR reflex     Status: None   Collection Time: 07/25/16  8:29 AM  Result Value Ref Range Status   C Diff antigen NEGATIVE NEGATIVE Final   C Diff toxin NEGATIVE NEGATIVE Final   C Diff interpretation No C. difficile detected.  Final    Radiology Reports US Renal  Result Date: 07/24/2016 CLINICAL DATA:  Hypertension and liver disease EXAM: RENAL / URINARY TRACT ULTRASOUND COMPLETE COMPARISON:  CT abdomen pelvis 04/19/2015 FINDINGS: Right Kidney: Length: 9.4 cm. There is increased echogenicity of the right kidney. Near the lower pole, there is a shadowing calculus measuring 12 mm. No hydronephrosis or solid renal mass. Left Kidney: Length: 9.5 cm. There is increased echogenicity of the left kidney. There is a 7 mm nonobstructive renal calculus seen. There is a renal cyst measuring 2.0 x 1.4 x 1.6 cm. No hydronephrosis. Bladder: Appears normal for degree of bladder distention. IMPRESSION: 1. Increased echogenicity of both kidneys, suggesting chronic medical renal disease. 2. Bilateral nonobstructing  nephrolithiasis with largest stone measuring 1.2 cm. Electronically Signed   By: Deatra Robinson M.D.   On: 07/24/2016 22:52   Portable Chest 1 View  Result Date: 07/24/2016 CLINICAL DATA:  Acute kidney injury EXAM: PORTABLE CHEST 1 VIEW COMPARISON:  04/19/2015 FINDINGS: The heart size and mediastinal contours are within normal limits. Both lungs are clear. The visualized skeletal structures are unremarkable. IMPRESSION: No active disease. Electronically Signed   By: Signa Kell M.D.   On: 07/24/2016 09:28   Dg Abd Portable 1v  Result Date: 07/24/2016 CLINICAL DATA:  Acute kidney injury.  Back pain. EXAM: PORTABLE ABDOMEN - 1 VIEW COMPARISON:  04/26/2015 FINDINGS: The bowel gas pattern is normal. Large stone in the right kidney is again noted measuring 1.4 cm. IMPRESSION: 1. Nonobstructive bowel gas pattern. 2. Right renal stone. Electronically Signed   By: Signa Kellaylor  Stroud M.D.   On: 07/24/2016 09:30     CBC  Recent Labs Lab 07/23/16 1605 07/24/16 0944 07/25/16 0255 07/26/16 0226 07/27/16 0338  WBC 9.8 11.3* 10.8* 9.9 8.0  HGB 9.7* 10.5* 9.6* 8.8* 8.4*  HCT 28.8* 31.3* 28.6* 26.5* 25.4*  PLT 294 328 294 267 272  MCV 96.3 94.0 93.5 93.6 94.4  MCH 32.4 31.5 31.4 31.1 31.2  MCHC 33.7 33.5 33.6 33.2 33.1  RDW 14.5 15.0 15.2 15.0 14.8  LYMPHSABS 1.4 1.4 1.2  --   --   MONOABS 0.4 0.6 0.8  --   --   EOSABS 0.0 0.0 0.1  --   --   BASOSABS 0.0 0.0 0.0  --   --     Chemistries   Recent Labs Lab 07/23/16 1605 07/24/16 0944 07/25/16 0255 07/26/16 0226 07/27/16 0338  NA 140 140 140 139 137  K 3.9 3.3* 4.3 4.2 3.7  CL 117* 117* 117* 118* 117*  CO2 14* 13* 12* 13* 13*  GLUCOSE 89 144* 115* 111* 103*  BUN 39* 34* 30* 28* 29*  CREATININE 3.23* 3.36* 3.22* 3.32* 3.32*  CALCIUM 8.8* 8.4* 9.2 9.5 9.1  MG  --  0.8* 1.4* 1.4* 1.5*  AST 16 21 22 19   --   ALT 10* 12* 15 17  --   ALKPHOS 318* 318* 268* 261*  --   BILITOT 0.4 0.7 0.9 0.5  --     ------------------------------------------------------------------------------------------------------------------ estimated creatinine clearance is 22.6 mL/min (by C-G formula based on SCr of 3.32 mg/dL (H)). ------------------------------------------------------------------------------------------------------------------ No results for input(s): HGBA1C in the last 72 hours. ------------------------------------------------------------------------------------------------------------------ No results for input(s): CHOL, HDL, LDLCALC, TRIG, CHOLHDL, LDLDIRECT in the last 72 hours. ------------------------------------------------------------------------------------------------------------------ No results for input(s): TSH, T4TOTAL, T3FREE, THYROIDAB in the last 72 hours.  Invalid input(s): FREET3 ------------------------------------------------------------------------------------------------------------------ No results for input(s): VITAMINB12, FOLATE, FERRITIN, TIBC, IRON, RETICCTPCT in the last 72 hours.  Coagulation profile No results for input(s): INR, PROTIME in the last 168 hours.  No results for input(s): DDIMER in the last 72 hours.  Cardiac Enzymes  Recent Labs Lab 07/24/16 0944 07/24/16 1437 07/24/16 2112  TROPONINI 0.35* 0.26* 0.30*   ------------------------------------------------------------------------------------------------------------------ Invalid input(s): POCBNP   CBG: No results for input(s): GLUCAP in the last 168 hours.         Lab Results  Component Value Date   HGBA1C 4.5 (L) 07/24/2016   Lab Results  Component Value Date   CREATININE 3.32 (H) 07/27/2016       Scheduled Meds: . amLODipine  10 mg Oral Daily  . aspirin EC  81 mg Oral Daily  . carvedilol  25 mg Oral BID WC  . furosemide  40 mg Oral Daily  . heparin subcutaneous  5,000 Units Subcutaneous Q8H  . sodium bicarbonate  1,300 mg Oral TID  . sodium chloride flush  3 mL  Intravenous Q12H  . traZODone  100 mg Oral QHS   Continuous Infusions:     LOS: 3 days    Marnesha Gagen, MD, FACP, FHM. [facility] Pager [phone]  If 7PM-7AM, please contact night-coverage [email or web address] Password [email or web address] 07/27/2016, 2:45 PM

## 2016-07-28 DIAGNOSIS — R0789 Other chest pain: Secondary | ICD-10-CM

## 2016-07-28 LAB — BASIC METABOLIC PANEL
ANION GAP: 7 (ref 5–15)
BUN: 30 mg/dL — ABNORMAL HIGH (ref 6–20)
CHLORIDE: 115 mmol/L — AB (ref 101–111)
CO2: 17 mmol/L — AB (ref 22–32)
CREATININE: 3.54 mg/dL — AB (ref 0.44–1.00)
Calcium: 9.2 mg/dL (ref 8.9–10.3)
GFR calc non Af Amer: 13 mL/min — ABNORMAL LOW (ref >60)
GFR, EST AFRICAN AMERICAN: 15 mL/min — AB (ref >60)
Glucose, Bld: 102 mg/dL — ABNORMAL HIGH (ref 65–99)
POTASSIUM: 3.2 mmol/L — AB (ref 3.5–5.1)
Sodium: 139 mmol/L (ref 135–145)

## 2016-07-28 LAB — CBC
HEMATOCRIT: 25.4 % — AB (ref 36.0–46.0)
HEMOGLOBIN: 8.4 g/dL — AB (ref 12.0–15.0)
MCH: 30.9 pg (ref 26.0–34.0)
MCHC: 33.1 g/dL (ref 30.0–36.0)
MCV: 93.4 fL (ref 78.0–100.0)
Platelets: 290 10*3/uL (ref 150–400)
RBC: 2.72 MIL/uL — AB (ref 3.87–5.11)
RDW: 14.8 % (ref 11.5–15.5)
WBC: 7.9 10*3/uL (ref 4.0–10.5)

## 2016-07-28 LAB — MAGNESIUM: Magnesium: 1.8 mg/dL (ref 1.7–2.4)

## 2016-07-28 MED ORDER — SODIUM CHLORIDE 0.9 % IV SOLN
INTRAVENOUS | Status: AC
  Administered 2016-07-28: 11:00:00 via INTRAVENOUS

## 2016-07-28 NOTE — Progress Notes (Addendum)
[facility] PROGRESS NOTE  Heather Vasquez ZOX:[id number] DOB: Jan 30, 1951 DOA: 07/23/2016   PCP: PROVIDER NOT IN SYSTEM     Assessment/Plan: Principal Problem:   Hypertensive emergency Active Problems:   Acute kidney injury (nontraumatic) (HCC)   Nausea vomiting and diarrhea   Chronic abdominal pain   UTI (urinary tract infection)   Chest pain at rest   Elevated troponin    65 year old female with a past medical history of HTN, chronic renal insufficiency, and liver disease. She presented to [facility] ED on 07/24/16 for nausea, vomiting and abdominal pain that started the day prior. Also in hypertensive crisis after being out of her medications for a week. Found to have elevated troponin, cardiology consulted. BP of 204/108. She was started on a Nitroglycerin gtt as well as Cardene gtt and transferred to the ICU for hypertensive urgency. After stabilization, transferred to telemetry. Cardiology seen and signed off.  Assessment and plan 1. Hypertensive Emergency: Transferred to ICU for management of a chest pain/hypertensive urgency . Blood pressure improving. Initially required cardene gtt. now on Norvasc and Coreg, negative urine drug screen, normal TSH. Resumed Coreg. Tapered off cardene gtt.  . Much improved blood pressure control, suspicious for noncompliance  . Patient counseled extensively regarding importance of compliance with all aspects of medical care and she verbalized understanding.  2. Chest pain/elevated troponin, slightly Elevated Troponin I: Cardiology  consulted . As per cardiology input, elevated troponin secondary to demand and this is not a clinical acute coronary syndrome. No ischemic workup recommended. IV heparin which was started was discontinued. 3. Acute on chronic renal failure: Creatinine 2.75 in June 2017, now 3.5, Likely due to uncontrolled hypertension vs prerenal in the setting of diarrhea. Continuing to trend electrolytes and renal function  daily. Renal ultrasound shows bilateral nonobstructing nephrolithiasis .ACE/ARB and spironolactone are contraindicated, may add lasix per cardiology if renal fn improving. We'll hydrate gently with IV fluids in the setting of ongoing diarrhea. Followed by Dr. Leo Rod ,nephrologist in [facility] and last visit was 2 weeks ago , assess need for possible kidney biopsy once patient is discharged.   4. Klebsiella UTI: Urine culture from 10/25 pending. Klebsiella UTI. Completed 4 days of sensitive ceftriaxone-discontinued. 5. Hypokalemia: Mild/replaced 6. Hyperglycemia: No history of diabetes mellitus. Hemoglobin A1c 4.5. Monitoring serum glucose with daily labs. 7. Diarrhea: Chronic  patient is followed by [facility] Pilar Jarvis, MD. Question possible gastroenteritis. C. difficile & GI pathogen panel testing negative. Tolerating liquid diet. Advanced to regular consistency diet. As per patient, after colon surgery last year, she has been having intermittent episodes of diarrhea up to 3 times per year and this is similar to those episodes. However diarrhea has lasted much longer this time than prior episodes.  Continue PRN Imodium  8. H/O Liver Disease: Elevated alkaline phosphatase, unclear etiology, rest of the liver function panel is normal. Patient indicates that she is told to have cirrhosis of unclear etiology.  9. Hypomagnesemia-replete cautiously given chronic kidney disease and follow BMP. 10. Non-anion gap metabolic acidosis: Likely secondary to diarrhea complicating chronic kidney disease. Discussed with nephrologist on call on 10/28 who recommended adding sodium bicarbonate 1300 mg 3 times a day.  11. Anemia: Probably related to chronic kidney disease and chronic disease. Hemoglobin gradually dropping. Follow CBC in a.m.      DVT prophylaxsis  Heparin sq  Code Status:  Full code    Family Communication: Discussed in detail with the  patient, all imaging results, lab results  explained to the patient   Disposition Plan: DC home when medically stable, possibly 10/31  pending improvement in diarrhea and stability of her metabolic panel.  Consultants:  Cardiology  Pulmonary  Procedures:     Antibiotics: Anti-infectives    Start     Dose/Rate Route Frequency Ordered Stop   07/24/16 1800  cefTRIAXone (ROCEPHIN) 1 g in dextrose 5 % 50 mL IVPB  Status:  Discontinued     1 g 100 mL/hr over 30 Minutes Intravenous Every 24 hours 07/24/16 0748 07/26/16 1820   07/24/16 1000  rifaximin (XIFAXAN) tablet 550 mg  Status:  Discontinued     550 mg Oral 3 times daily 07/24/16 0748 07/24/16 0829   07/23/16 1815  cefTRIAXone (ROCEPHIN) 1 g in dextrose 5 % 50 mL IVPB     1 g 100 mL/hr over 30 Minutes Intravenous  Once 07/23/16 1808 07/23/16 1928         HPI/Subjective:  3 episodes of diarrhea today  Objective: Vitals:   07/27/16 1500 07/27/16 1713 07/27/16 2021 07/28/16 0533  BP: 137/65 130/68 129/61 129/68  Pulse: 70 77 76 80  Resp: 20  18 18   Temp:   98.9 F (37.2 C) 99 F (37.2 C)  TempSrc:   Oral Oral  SpO2: 100%  99% 98%  Weight:      Height:        Intake/Output Summary (Last 24 hours) at 07/28/16 0956 Last data filed at 07/27/16 1645  Gross per 24 hour  Intake              480 ml  Output                4 ml  Net              476 ml    Exam:  Examination:  General: Pleasant, NAD Psych: Normal affect. Neuro: Alert and oriented X 3. Moves all extremities spontaneously. HEENT: Normal           Neck: Supple without bruits or JVD. Lungs:  Resp regular and unlabored, CTA. Heart: RRR no s3, s4, or murmurs.Telemetry: Sinus rhythm.  Abdomen: Soft, non-tender, non-distended, BS + x 4.  Extremities: No clubbing, cyanosis or edema. DP/PT/Radials 2+ and equal bilaterally.   Data Reviewed: I have personally reviewed following labs and imaging studies  Micro Results Recent Results (from the past 240 hour(s))  Urine culture     Status: Abnormal    Collection Time: 07/23/16  5:00 PM  Result Value Ref Range Status   Specimen Description URINE, RANDOM  Final   Special Requests NONE  Final   Culture >=100,000 COLONIES/mL KLEBSIELLA PNEUMONIAE (A)  Final   Report Status 07/26/2016 FINAL  Final   Organism ID, Bacteria KLEBSIELLA PNEUMONIAE (A)  Final      Susceptibility   Klebsiella pneumoniae - MIC*    AMPICILLIN <=2 RESISTANT Resistant     CEFAZOLIN <=4 SENSITIVE Sensitive     CEFTRIAXONE <=1 SENSITIVE Sensitive     CIPROFLOXACIN <=0.25 SENSITIVE Sensitive     GENTAMICIN <=1 SENSITIVE Sensitive     IMIPENEM <=0.25 SENSITIVE Sensitive     NITROFURANTOIN <=16 SENSITIVE Sensitive     TRIMETH/SULFA <=20 SENSITIVE Sensitive     AMPICILLIN/SULBACTAM <=2 SENSITIVE Sensitive     PIP/TAZO <=4 SENSITIVE Sensitive     Extended ESBL NEGATIVE Sensitive     * >=100,000 COLONIES/mL KLEBSIELLA PNEUMONIAE  MRSA PCR Screening  Status: None   Collection Time: 07/24/16  5:45 AM  Result Value Ref Range Status   MRSA by PCR NEGATIVE NEGATIVE Final    Comment:        The GeneXpert MRSA Assay (FDA approved for NASAL specimens only), is one component of a comprehensive MRSA colonization surveillance program. It is not intended to diagnose MRSA infection nor to guide or monitor treatment for MRSA infections.   Gastrointestinal Panel by PCR , Stool     Status: None   Collection Time: 07/25/16  8:29 AM  Result Value Ref Range Status   Campylobacter species NOT DETECTED NOT DETECTED Final   Plesimonas shigelloides NOT DETECTED NOT DETECTED Final   Salmonella species NOT DETECTED NOT DETECTED Final   Yersinia enterocolitica NOT DETECTED NOT DETECTED Final   Vibrio species NOT DETECTED NOT DETECTED Final   Vibrio cholerae NOT DETECTED NOT DETECTED Final   Enteroaggregative E coli (EAEC) NOT DETECTED NOT DETECTED Final   Enteropathogenic E coli (EPEC) NOT DETECTED NOT DETECTED Final   Enterotoxigenic E coli (ETEC) NOT DETECTED NOT DETECTED  Final   Shiga like toxin producing E coli (STEC) NOT DETECTED NOT DETECTED Final   Shigella/Enteroinvasive E coli (EIEC) NOT DETECTED NOT DETECTED Final   Cryptosporidium NOT DETECTED NOT DETECTED Final   Cyclospora cayetanensis NOT DETECTED NOT DETECTED Final   Entamoeba histolytica NOT DETECTED NOT DETECTED Final   Giardia lamblia NOT DETECTED NOT DETECTED Final   Adenovirus F40/41 NOT DETECTED NOT DETECTED Final   Astrovirus NOT DETECTED NOT DETECTED Final   Norovirus GI/GII NOT DETECTED NOT DETECTED Final   Rotavirus A NOT DETECTED NOT DETECTED Final   Sapovirus (I, II, IV, and V) NOT DETECTED NOT DETECTED Final  C difficile quick scan w PCR reflex     Status: None   Collection Time: 07/25/16  8:29 AM  Result Value Ref Range Status   C Diff antigen NEGATIVE NEGATIVE Final   C Diff toxin NEGATIVE NEGATIVE Final   C Diff interpretation No C. difficile detected.  Final    Radiology Reports [place] Renal  Result Date: 07/24/2016 CLINICAL DATA:  Hypertension and liver disease EXAM: RENAL / URINARY TRACT ULTRASOUND COMPLETE COMPARISON:  CT abdomen pelvis 04/19/2015 FINDINGS: Right Kidney: Length: 9.4 cm. There is increased echogenicity of the right kidney. Near the lower pole, there is a shadowing calculus measuring 12 mm. No hydronephrosis or solid renal mass. Left Kidney: Length: 9.5 cm. There is increased echogenicity of the left kidney. There is a 7 mm nonobstructive renal calculus seen. There is a renal cyst measuring 2.0 x 1.4 x 1.6 cm. No hydronephrosis. Bladder: Appears normal for degree of bladder distention. IMPRESSION: 1. Increased echogenicity of both kidneys, suggesting chronic medical renal disease. 2. Bilateral nonobstructing nephrolithiasis with largest stone measuring 1.2 cm. Electronically Signed   By: Deatra RobinsonKevin  Herman M.D.   On: 07/24/2016 22:52   Portable Chest 1 View  Result Date: 07/24/2016 CLINICAL DATA:  Acute kidney injury EXAM: PORTABLE CHEST 1 VIEW COMPARISON:   04/19/2015 FINDINGS: The heart size and mediastinal contours are within normal limits. Both lungs are clear. The visualized skeletal structures are unremarkable. IMPRESSION: No active disease. Electronically Signed   By: Signa Kellaylor  Stroud M.D.   On: 07/24/2016 09:28   Dg Abd Portable 1v  Result Date: 07/24/2016 CLINICAL DATA:  Acute kidney injury.  Back pain. EXAM: PORTABLE ABDOMEN - 1 VIEW COMPARISON:  04/26/2015 FINDINGS: The bowel gas pattern is normal. Large stone in the right kidney is  again noted measuring 1.4 cm. IMPRESSION: 1. Nonobstructive bowel gas pattern. 2. Right renal stone. Electronically Signed   By: Signa Kell M.D.   On: 07/24/2016 09:30     CBC  Recent Labs Lab 07/23/16 1605 07/24/16 0944 07/25/16 0255 07/26/16 0226 07/27/16 0338 07/28/16 0228  WBC 9.8 11.3* 10.8* 9.9 8.0 7.9  HGB 9.7* 10.5* 9.6* 8.8* 8.4* 8.4*  HCT 28.8* 31.3* 28.6* 26.5* 25.4* 25.4*  PLT 294 328 294 267 272 290  MCV 96.3 94.0 93.5 93.6 94.4 93.4  MCH 32.4 31.5 31.4 31.1 31.2 30.9  MCHC 33.7 33.5 33.6 33.2 33.1 33.1  RDW 14.5 15.0 15.2 15.0 14.8 14.8  LYMPHSABS 1.4 1.4 1.2  --   --   --   MONOABS 0.4 0.6 0.8  --   --   --   EOSABS 0.0 0.0 0.1  --   --   --   BASOSABS 0.0 0.0 0.0  --   --   --     Chemistries   Recent Labs Lab 07/23/16 1605 07/24/16 0944 07/25/16 0255 07/26/16 0226 07/27/16 0338 07/28/16 0228  NA 140 140 140 139 137 139  K 3.9 3.3* 4.3 4.2 3.7 3.2*  CL 117* 117* 117* 118* 117* 115*  CO2 14* 13* 12* 13* 13* 17*  GLUCOSE 89 144* 115* 111* 103* 102*  BUN 39* 34* 30* 28* 29* 30*  CREATININE 3.23* 3.36* 3.22* 3.32* 3.32* 3.54*  CALCIUM 8.8* 8.4* 9.2 9.5 9.1 9.2  MG  --  0.8* 1.4* 1.4* 1.5* 1.8  AST 16 21 22 19   --   --   ALT 10* 12* 15 17  --   --   ALKPHOS 318* 318* 268* 261*  --   --   BILITOT 0.4 0.7 0.9 0.5  --   --    ------------------------------------------------------------------------------------------------------------------ estimated creatinine  clearance is 21.2 mL/min (by C-G formula based on SCr of 3.54 mg/dL (H)). ------------------------------------------------------------------------------------------------------------------ No results for input(s): HGBA1C in the last 72 hours. ------------------------------------------------------------------------------------------------------------------ No results for input(s): CHOL, HDL, LDLCALC, TRIG, CHOLHDL, LDLDIRECT in the last 72 hours. ------------------------------------------------------------------------------------------------------------------ No results for input(s): TSH, T4TOTAL, T3FREE, THYROIDAB in the last 72 hours.  Invalid input(s): FREET3 ------------------------------------------------------------------------------------------------------------------ No results for input(s): VITAMINB12, FOLATE, FERRITIN, TIBC, IRON, RETICCTPCT in the last 72 hours.  Coagulation profile No results for input(s): INR, PROTIME in the last 168 hours.  No results for input(s): DDIMER in the last 72 hours.  Cardiac Enzymes  Recent Labs Lab 07/24/16 0944 07/24/16 1437 07/24/16 2112  TROPONINI 0.35* 0.26* 0.30*   ------------------------------------------------------------------------------------------------------------------ Invalid input(s): POCBNP   CBG: No results for input(s): GLUCAP in the last 168 hours.         Lab Results  Component Value Date   HGBA1C 4.5 (L) 07/24/2016   Lab Results  Component Value Date   CREATININE 3.54 (H) 07/28/2016       Scheduled Meds: . amLODipine  10 mg Oral Daily  . aspirin EC  81 mg Oral Daily  . carvedilol  25 mg Oral BID WC  . furosemide  40 mg Oral Daily  . heparin subcutaneous  5,000 Units Subcutaneous Q8H  . sodium bicarbonate  1,300 mg Oral TID  . sodium chloride flush  3 mL Intravenous Q12H  . traZODone  100 mg Oral QHS   Continuous Infusions: . sodium chloride       LOS: 4 days    Richarda Overlie, MD, FACP,  FHM. [facility] Pager [phone]  If 7PM-7AM, please contact  night-coverage [email or web address] Password [email or web address] 07/28/2016, 9:56 AM

## 2016-07-29 DIAGNOSIS — N390 Urinary tract infection, site not specified: Secondary | ICD-10-CM

## 2016-07-29 LAB — COMPREHENSIVE METABOLIC PANEL
ALK PHOS: 210 U/L — AB (ref 38–126)
ALT: 16 U/L (ref 14–54)
ANION GAP: 6 (ref 5–15)
AST: 14 U/L — ABNORMAL LOW (ref 15–41)
Albumin: 2.9 g/dL — ABNORMAL LOW (ref 3.5–5.0)
BILIRUBIN TOTAL: 0.4 mg/dL (ref 0.3–1.2)
BUN: 35 mg/dL — AB (ref 6–20)
CALCIUM: 8.9 mg/dL (ref 8.9–10.3)
CO2: 18 mmol/L — ABNORMAL LOW (ref 22–32)
Chloride: 115 mmol/L — ABNORMAL HIGH (ref 101–111)
Creatinine, Ser: 3.27 mg/dL — ABNORMAL HIGH (ref 0.44–1.00)
GFR calc Af Amer: 16 mL/min — ABNORMAL LOW (ref >60)
GFR, EST NON AFRICAN AMERICAN: 14 mL/min — AB (ref >60)
GLUCOSE: 101 mg/dL — AB (ref 65–99)
POTASSIUM: 3.4 mmol/L — AB (ref 3.5–5.1)
Sodium: 139 mmol/L (ref 135–145)
TOTAL PROTEIN: 5.1 g/dL — AB (ref 6.5–8.1)

## 2016-07-29 LAB — CBC
HEMATOCRIT: 22.2 % — AB (ref 36.0–46.0)
Hemoglobin: 7.5 g/dL — ABNORMAL LOW (ref 12.0–15.0)
MCH: 32.2 pg (ref 26.0–34.0)
MCHC: 33.8 g/dL (ref 30.0–36.0)
MCV: 95.3 fL (ref 78.0–100.0)
Platelets: 262 10*3/uL (ref 150–400)
RBC: 2.33 MIL/uL — ABNORMAL LOW (ref 3.87–5.11)
RDW: 15.2 % (ref 11.5–15.5)
WBC: 7.6 10*3/uL (ref 4.0–10.5)

## 2016-07-29 MED ORDER — FERROUS SULFATE 300 (60 FE) MG/5ML PO SYRP: 300.0000 mg | ORAL_SOLUTION | Freq: Every day | ORAL | 3 refills | Status: AC

## 2016-07-29 MED ORDER — PANTOPRAZOLE SODIUM 40 MG PO TBEC: 40.0000 mg | DELAYED_RELEASE_TABLET | Freq: Every day | ORAL | 1 refills | Status: AC

## 2016-07-29 MED ORDER — ASPIRIN 81 MG PO TBEC: 81.0000 mg | DELAYED_RELEASE_TABLET | Freq: Every day | ORAL | 1 refills | Status: AC

## 2016-07-29 MED ORDER — AMLODIPINE BESYLATE 10 MG PO TABS: 10.0000 mg | ORAL_TABLET | Freq: Every day | ORAL | 1 refills | Status: AC

## 2016-07-29 MED ORDER — SODIUM BICARBONATE 650 MG PO TABS: 1300.0000 mg | ORAL_TABLET | Freq: Three times a day (TID) | ORAL | 1 refills | Status: AC

## 2016-07-29 NOTE — Care Management Note (Signed)
Case Management Note Donn PieriniKristi Teigan Manner RN, BSN Unit 2W-Case Manager [phone]  Patient Details  Name: Heather ChanceLorelei Vasquez MRN: [id number] Date of Birth: 04-Mar-1951  Subjective/Objective:  Pt admitted with HTN emergency                   Action/Plan: PTA pt lived at home with family- anticipate return home-  Expected Discharge Date:     07/29/16             Expected Discharge Plan:  Home/Self Care  In-House Referral:     Discharge planning Services  CM Consult  Post Acute Care Choice:    Choice offered to:     DME Arranged:    DME Agency:     HH Arranged:    HH Agency:     Status of Service:  Completed, signed off  If discussed at Long Length of Stay Meetings, dates discussed:  07/29/16  Additional Comments:  Darrold SpanWebster, Saraiyah Hemminger Hall, RN 07/29/2016, 11:27 AM

## 2016-07-29 NOTE — Progress Notes (Signed)
Order to discharge received,  IV and telemetry removed, CCMD notified.

## 2016-07-29 NOTE — Discharge Summary (Addendum)
Physician Discharge Summary  Heather Vasquez MRN: [id number] DOB/AGE: 1950-11-29 65 y.o.  PCP: PROVIDER NOT IN SYSTEM   Admit date: 07/23/2016 Discharge date: 07/29/2016  Discharge Diagnoses:    Principal Problem:   Hypertensive emergency Active Problems:   Acute kidney injury (nontraumatic) (HCC)   Nausea vomiting and diarrhea   Chronic abdominal pain   UTI (urinary tract infection)   Chest pain at rest   Elevated troponin   Chest discomfort   Acute lower UTI    Follow-up recommendations Follow-up with PCP in 3-5 days , including all  additional recommended appointments as below Follow-up CBC, CMP in 3-5 days Patient needs outpatient follow-up with nephrology for acute on chronic kidney disease which is progressively worsening over the last 1 year       Current Discharge Medication List    START taking these medications   Details  amLODipine (NORVASC) 10 MG tablet Take 1 tablet (10 mg total) by mouth daily. Qty: 30 tablet, Refills: 1    aspirin EC 81 MG EC tablet Take 1 tablet (81 mg total) by mouth daily. Qty: 30 tablet, Refills: 1    ferrous sulfate 300 (60 Fe) MG/5ML syrup Take 5 mLs (300 mg total) by mouth daily. Qty: 300 mL, Refills: 3    pantoprazole (PROTONIX) 40 MG tablet Take 1 tablet (40 mg total) by mouth daily. Qty: 30 tablet, Refills: 1    sodium bicarbonate 650 MG tablet Take 2 tablets (1,300 mg total) by mouth 3 (three) times daily. Qty: 90 tablet, Refills: 1      CONTINUE these medications which have NOT CHANGED   Details  carvedilol (COREG) 25 MG tablet Take 25 mg by mouth 2 (two) times daily. Refills: 5    gabapentin (NEURONTIN) 100 MG capsule Take 100 mg by mouth daily.    Magnesium Oxide 250 MG TABS Take 250 mg by mouth daily.     ondansetron (ZOFRAN ODT) 4 MG disintegrating tablet Take 1 tablet (4 mg total) by mouth every 8 (eight) hours as needed for nausea or vomiting. Qty: 20 tablet, Refills: 0    oxyCODONE-acetaminophen  (PERCOCET) 10-325 MG per tablet Take 1 tablet by mouth 2 (two) times daily.     traZODone (DESYREL) 100 MG tablet Take 100 mg by mouth at bedtime.    Vitamin D, Ergocalciferol, (DRISDOL) 50000 units CAPS capsule Take 50,000 Units by mouth 2 (two) times a week. On Tuesday and Thursday. Refills: 5      STOP taking these medications     spironolactone (ALDACTONE) 25 MG tablet        Discharge Condition: Stable   Discharge Instructions Get Medicines reviewed and adjusted: Please take all your medications with you for your next visit with your Primary MD  Please request your Primary MD to go over all hospital tests and procedure/radiological results at the follow up, please ask your Primary MD to get all Hospital records sent to his/her office.  If you experience worsening of your admission symptoms, develop shortness of breath, life threatening emergency, suicidal or homicidal thoughts you must seek medical attention immediately by calling 911 or calling your MD immediately if symptoms less severe.  You must read complete instructions/literature along with all the possible adverse reactions/side effects for all the Medicines you take and that have been prescribed to you. Take any new Medicines after you have completely understood and accpet all the possible adverse reactions/side effects.   Do not drive when taking Pain medications.   Do not  take more than prescribed Pain, Sleep and Anxiety Medications  Special Instructions: If you have smoked or chewed Tobacco in the last 2 yrs please stop smoking, stop any regular Alcohol and or any Recreational drug use.  Wear Seat belts while driving.  Please note  You were cared for by a hospitalist during your hospital stay. Once you are discharged, your primary care physician will handle any further medical issues. Please note that NO REFILLS for any discharge medications will be authorized once you are discharged, as it is imperative that  you return to your primary care physician (or establish a relationship with a primary care physician if you do not have one) for your aftercare needs so that they can reassess your need for medications and monitor your lab values.     Allergies  Allergen Reactions  . Enalapril Other (See Comments)    Cough       Disposition: Home   Consults:  Cardiology     Significant Diagnostic Studies:  US Renal  Result Date: 17-Aug-2016 CLINICAL DATA:  Hypertension and liver disease EXAM: RENAL / URINARY TRACT ULTRASOUND COMPLETE COMPARISON:  CT abdomen pelvis 04/19/2015 FINDINGS: Right Kidney: Length: 9.4 cm. There is increased echogenicity of the right kidney. Near the lower pole, there is a shadowing calculus measuring 12 mm. No hydronephrosis or solid renal mass. Left Kidney: Length: 9.5 cm. There is increased echogenicity of the left kidney. There is a 7 mm nonobstructive renal calculus seen. There is a renal cyst measuring 2.0 x 1.4 x 1.6 cm. No hydronephrosis. Bladder: Appears normal for degree of bladder distention. IMPRESSION: 1. Increased echogenicity of both kidneys, suggesting chronic medical renal disease. 2. Bilateral nonobstructing nephrolithiasis with largest stone measuring 1.2 cm. Electronically Signed   By: Ulyses Jarred M.D.   On: 2016/08/17 22:52   Portable Chest 1 View  Result Date: Aug 17, 2016 CLINICAL DATA:  Acute kidney injury EXAM: PORTABLE CHEST 1 VIEW COMPARISON:  04/19/2015 FINDINGS: The heart size and mediastinal contours are within normal limits. Both lungs are clear. The visualized skeletal structures are unremarkable. IMPRESSION: No active disease. Electronically Signed   By: Kerby Moors M.D.   On: August 17, 2016 09:28   Dg Abd Portable 1v  Result Date: 17-Aug-2016 CLINICAL DATA:  Acute kidney injury.  Back pain. EXAM: PORTABLE ABDOMEN - 1 VIEW COMPARISON:  04/26/2015 FINDINGS: The bowel gas pattern is normal. Large stone in the right kidney is again noted  measuring 1.4 cm. IMPRESSION: 1. Nonobstructive bowel gas pattern. 2. Right renal stone. Electronically Signed   By: Kerby Moors M.D.   On: 08/17/2016 09:30    2-D echo LV EF: 75% -   80%  ------------------------------------------------------------------- Indications:      Chest pain 786.51.  ------------------------------------------------------------------- History:   Risk factors:  Hypertension.  ------------------------------------------------------------------- Study Conclusions  - Left ventricle: The cavity size was moderately dilated. Wall   thickness was normal. Systolic function was vigorous. The   estimated ejection fraction was in the range of 75% to 80%. Wall   motion was normal; there were no regional wall motion   abnormalities. Doppler parameters are consistent with abnormal   left ventricular relaxation (grade 1 diastolic dysfunction). - Aortic valve: Mildly calcified annulus. Mildly thickened, mildly   calcified leaflets.   Filed Weights   08/17/2016 0620 Aug 17, 2016 1130 07/26/16 0528  Weight: 78.8 kg (173 lb 11.6 oz) 76 kg (167 lb 8.8 oz) 131.1 kg (289 lb)     Microbiology: Recent Results (from the past 240  hour(s))  Urine culture     Status: Abnormal   Collection Time: 07/23/16  5:00 PM  Result Value Ref Range Status   Specimen Description URINE, RANDOM  Final   Special Requests NONE  Final   Culture >=100,000 COLONIES/mL KLEBSIELLA PNEUMONIAE (A)  Final   Report Status 07/26/2016 FINAL  Final   Organism ID, Bacteria KLEBSIELLA PNEUMONIAE (A)  Final      Susceptibility   Klebsiella pneumoniae - MIC*    AMPICILLIN <=2 RESISTANT Resistant     CEFAZOLIN <=4 SENSITIVE Sensitive     CEFTRIAXONE <=1 SENSITIVE Sensitive     CIPROFLOXACIN <=0.25 SENSITIVE Sensitive     GENTAMICIN <=1 SENSITIVE Sensitive     IMIPENEM <=0.25 SENSITIVE Sensitive     NITROFURANTOIN <=16 SENSITIVE Sensitive     TRIMETH/SULFA <=20 SENSITIVE Sensitive      AMPICILLIN/SULBACTAM <=2 SENSITIVE Sensitive     PIP/TAZO <=4 SENSITIVE Sensitive     Extended ESBL NEGATIVE Sensitive     * >=100,000 COLONIES/mL KLEBSIELLA PNEUMONIAE  MRSA PCR Screening     Status: None   Collection Time: 07/24/16  5:45 AM  Result Value Ref Range Status   MRSA by PCR NEGATIVE NEGATIVE Final    Comment:        The GeneXpert MRSA Assay (FDA approved for NASAL specimens only), is one component of a comprehensive MRSA colonization surveillance program. It is not intended to diagnose MRSA infection nor to guide or monitor treatment for MRSA infections.   Gastrointestinal Panel by PCR , Stool     Status: None   Collection Time: 07/25/16  8:29 AM  Result Value Ref Range Status   Campylobacter species NOT DETECTED NOT DETECTED Final   Plesimonas shigelloides NOT DETECTED NOT DETECTED Final   Salmonella species NOT DETECTED NOT DETECTED Final   Yersinia enterocolitica NOT DETECTED NOT DETECTED Final   Vibrio species NOT DETECTED NOT DETECTED Final   Vibrio cholerae NOT DETECTED NOT DETECTED Final   Enteroaggregative E coli (EAEC) NOT DETECTED NOT DETECTED Final   Enteropathogenic E coli (EPEC) NOT DETECTED NOT DETECTED Final   Enterotoxigenic E coli (ETEC) NOT DETECTED NOT DETECTED Final   Shiga like toxin producing E coli (STEC) NOT DETECTED NOT DETECTED Final   Shigella/Enteroinvasive E coli (EIEC) NOT DETECTED NOT DETECTED Final   Cryptosporidium NOT DETECTED NOT DETECTED Final   Cyclospora cayetanensis NOT DETECTED NOT DETECTED Final   Entamoeba histolytica NOT DETECTED NOT DETECTED Final   Giardia lamblia NOT DETECTED NOT DETECTED Final   Adenovirus F40/41 NOT DETECTED NOT DETECTED Final   Astrovirus NOT DETECTED NOT DETECTED Final   Norovirus GI/GII NOT DETECTED NOT DETECTED Final   Rotavirus A NOT DETECTED NOT DETECTED Final   Sapovirus (I, II, IV, and V) NOT DETECTED NOT DETECTED Final  C difficile quick scan w PCR reflex     Status: None   Collection  Time: 07/25/16  8:29 AM  Result Value Ref Range Status   C Diff antigen NEGATIVE NEGATIVE Final   C Diff toxin NEGATIVE NEGATIVE Final   C Diff interpretation No C. difficile detected.  Final       Blood Culture    Component Value Date/Time   SDES URINE, RANDOM 07/23/2016 1700   SPECREQUEST NONE 07/23/2016 1700   CULT >=100,000 COLONIES/mL KLEBSIELLA PNEUMONIAE (A) 07/23/2016 1700   REPTSTATUS 07/26/2016 FINAL 07/23/2016 1700      Labs: Results for orders placed or performed during the hospital encounter of 07/23/16 (from the past  48 hour(s))  Basic metabolic panel     Status: Abnormal   Collection Time: 07/28/16  2:28 AM  Result Value Ref Range   Sodium 139 135 - 145 mmol/L   Potassium 3.2 (L) 3.5 - 5.1 mmol/L   Chloride 115 (H) 101 - 111 mmol/L   CO2 17 (L) 22 - 32 mmol/L   Glucose, Bld 102 (H) 65 - 99 mg/dL   BUN 30 (H) 6 - 20 mg/dL   Creatinine, Ser 3.54 (H) 0.44 - 1.00 mg/dL   Calcium 9.2 8.9 - 10.3 mg/dL   GFR calc non Af Amer 13 (L) >60 mL/min   GFR calc Af Amer 15 (L) >60 mL/min    Comment: (NOTE) The eGFR has been calculated using the CKD EPI equation. This calculation has not been validated in all clinical situations. eGFR's persistently <60 mL/min signify possible Chronic Kidney Disease.    Anion gap 7 5 - 15  Magnesium     Status: None   Collection Time: 07/28/16  2:28 AM  Result Value Ref Range   Magnesium 1.8 1.7 - 2.4 mg/dL  CBC     Status: Abnormal   Collection Time: 07/28/16  2:28 AM  Result Value Ref Range   WBC 7.9 4.0 - 10.5 K/uL   RBC 2.72 (L) 3.87 - 5.11 MIL/uL   Hemoglobin 8.4 (L) 12.0 - 15.0 g/dL   HCT 25.4 (L) 36.0 - 46.0 %   MCV 93.4 78.0 - 100.0 fL   MCH 30.9 26.0 - 34.0 pg   MCHC 33.1 30.0 - 36.0 g/dL   RDW 14.8 11.5 - 15.5 %   Platelets 290 150 - 400 K/uL  Comprehensive metabolic panel     Status: Abnormal   Collection Time: 07/29/16  1:40 AM  Result Value Ref Range   Sodium 139 135 - 145 mmol/L   Potassium 3.4 (L) 3.5 - 5.1  mmol/L   Chloride 115 (H) 101 - 111 mmol/L   CO2 18 (L) 22 - 32 mmol/L   Glucose, Bld 101 (H) 65 - 99 mg/dL   BUN 35 (H) 6 - 20 mg/dL   Creatinine, Ser 3.27 (H) 0.44 - 1.00 mg/dL   Calcium 8.9 8.9 - 10.3 mg/dL   Total Protein 5.1 (L) 6.5 - 8.1 g/dL   Albumin 2.9 (L) 3.5 - 5.0 g/dL   AST 14 (L) 15 - 41 U/L   ALT 16 14 - 54 U/L   Alkaline Phosphatase 210 (H) 38 - 126 U/L   Total Bilirubin 0.4 0.3 - 1.2 mg/dL   GFR calc non Af Amer 14 (L) >60 mL/min   GFR calc Af Amer 16 (L) >60 mL/min    Comment: (NOTE) The eGFR has been calculated using the CKD EPI equation. This calculation has not been validated in all clinical situations. eGFR's persistently <60 mL/min signify possible Chronic Kidney Disease.    Anion gap 6 5 - 15  CBC     Status: Abnormal   Collection Time: 07/29/16  1:40 AM  Result Value Ref Range   WBC 7.6 4.0 - 10.5 K/uL   RBC 2.33 (L) 3.87 - 5.11 MIL/uL   Hemoglobin 7.5 (L) 12.0 - 15.0 g/dL   HCT 22.2 (L) 36.0 - 46.0 %   MCV 95.3 78.0 - 100.0 fL   MCH 32.2 26.0 - 34.0 pg   MCHC 33.8 30.0 - 36.0 g/dL   RDW 15.2 11.5 - 15.5 %   Platelets 262 150 - 400 K/uL  Lipid Panel  No results found for: CHOL, TRIG, HDL, CHOLHDL, VLDL, LDLCALC, LDLDIRECT   Lab Results  Component Value Date   HGBA1C 4.5 (L) 07/24/2016     Lab Results  Component Value Date   CREATININE 3.27 (H) 07/29/2016     HPI :  Heather Vasquez is a 65 year old female with a past medical history of HTN, chronic renal insufficiency, and liver disease. She presented to [facility] ED on 07/24/16 for nausea, vomiting and abdominal pain that started the day prior. Also in hypertensive crisis after being out of her medications for a week. BP of 204/108. She was started on a Nitroglycerin gtt as well as Cardene gtt and transferred to the ICU for hypertensive urgency. She has not taken her anti-hypertensives for about a week. She is on Coreg '25mg'$  BID at home and '25mg'$  of Spironolactone. These are the only BP meds  she takes Found to have elevated troponin, cardiology consultedAfter stabilization, transferred to telemetry.    HOSPITAL COURSE:   1. Hypertensive Emergency: Transferred to ICU for management of a chest pain/hypertensive urgency . Blood pressure improving. Initially required cardene gtt. now on Norvasc and Coreg, negative urine drug screen, normal TSH. Resumed Coreg. Tapered off cardene gtt.  . Much improved blood pressure control, suspicious for noncompliance  . Patient counseled extensively regarding importance of compliance with all aspects of medical care and she verbalized understanding.  2. Chest pain/elevated troponin, slightly Elevated Troponin I: Cardiology  consulted . As per cardiology input, elevated troponin secondary to demand and this is not a clinical acute coronary syndrome. No ischemic workup recommended. IV heparin which was started was discontinued. 3. Acute on chronic renal failure: Creatinine 2.75 in June 2017, now 3.54>3.27 prior to dc , Likely due to uncontrolled hypertension vs prerenal in the setting of diarrhea. Continuing to trend electrolytes and renal function daily. Renal ultrasound shows bilateral nonobstructing nephrolithiasis .ACE/ARB and spironolactone arecontraindicated, may add lasix per cardiology if renal fn improving. Some improvement in renal function after gentle IV hydration given ongoing diarrhea.  Followed by Dr. Genevie Ann ,nephrologist in [facility] and last visit was 2 weeks ago , assess need for possible kidney biopsy once patient is discharged.   4. Klebsiella UTI: Urine culture from 10/25 pending. Klebsiella UTI. Completed 4 days of sensitive ceftriaxone-discontinued. 5. Hypokalemia: Mild/replaced 6. Hyperglycemia: No history of diabetes mellitus. Hemoglobin A1c 4.5. Monitoring serum glucose with daily labs. 7. Diarrhea: Chronic  patient is followed by [facility] Joslyn Hy, MD. Question possible gastroenteritis. C. difficile & GI pathogen  panel testing negative. Tolerating liquid diet. Advanced to regular consistency diet. As per patient, after colon surgery last year, she has been having intermittent episodes of diarrhea up to 3 times per year and this is similar to those episodes. However diarrhea has lasted much longer this time than prior episodes.  Continue PRN Imodium  8. H/O Liver Disease: Elevated alkaline phosphatase, unclear etiology, rest of the liver function panel is normal. Patient indicates that she is told to have cirrhosis of unclear etiology.  9. Hypomagnesemia-replete cautiously given chronic kidney disease and follow BMP. 10. Non-anion gap metabolic acidosis: Likely secondary to diarrhea complicating chronic kidney disease. Discussed with nephrologist on call on 10/28 who recommended adding sodium bicarbonate 1300 mg 3 times a day.  11. Anemia: Probably related to chronic kidney disease and chronic disease. Hemoglobin gradually dropping. Follow CBC closely. Patient may need aranesp and iron,  Discharge Exam:   Blood pressure Marland Kitchen)  119/54, pulse 74, temperature 97.6 F (36.4 C), temperature source Oral, resp. rate 18, height '5\' 2"'$  (1.575 m), weight 131.1 kg (289 lb), SpO2 99 %. General:Pleasant, NAD Psych: Normal affect. Neuro: Alert and oriented X 3. Moves all extremities spontaneously. HEENT:Normal Neck: Supple without bruits or JVD. Lungs: Resp regular and unlabored, CTA. Heart:RRR no s3, s4, or murmurs.Telemetry: Sinus rhythm.  Abdomen: Soft, non-tender, non-distended, BS + x 4.  Extremities:No clubbing, cyanosis or edema. DP/PT/Radials 2+ and equal bilaterally     Follow-up Information    Primary care provider. Schedule an appointment as soon as possible for a visit in 2 day(s).   Why:  Hospital follow-up       STOVALL, KATHRYN R, PA-C. Schedule an appointment as soon as possible for a visit in 1 week(s).   Specialty:  Nephrology Why:  Please call nephrology for appointment, hospital  follow-up Contact information: [address]  [place] [address] [phone]           Signed: Reyne Dumas 07/29/2016, 10:13 AM        Time spent >45 mins

## 2016-07-29 NOTE — Progress Notes (Signed)
SATURATION QUALIFICATIONS: (This note is used to comply with regulatory documentation for home oxygen)  Patient Saturations on Room Air at Rest = 100%  Patient Saturations on Room Air while Ambulating = 100%  Patient Saturations on N/A Liters of oxygen while Ambulating = N/A  Please briefly explain why patient needs home oxygen: 

## 2016-07-29 NOTE — Progress Notes (Signed)
Vital signs assessed prior to and after ambulation per orders.  Entered into epic.

## 2017-03-20 IMAGING — DX DG CHEST 2V
2 series · 2 of 2 positions shown · non-contrast
Comparison: 07/04/2013 report, images not available.

CLINICAL DATA: Altered mental status feet.  Recent fall.

EXAM:
CHEST  2 VIEW

[chest lat]
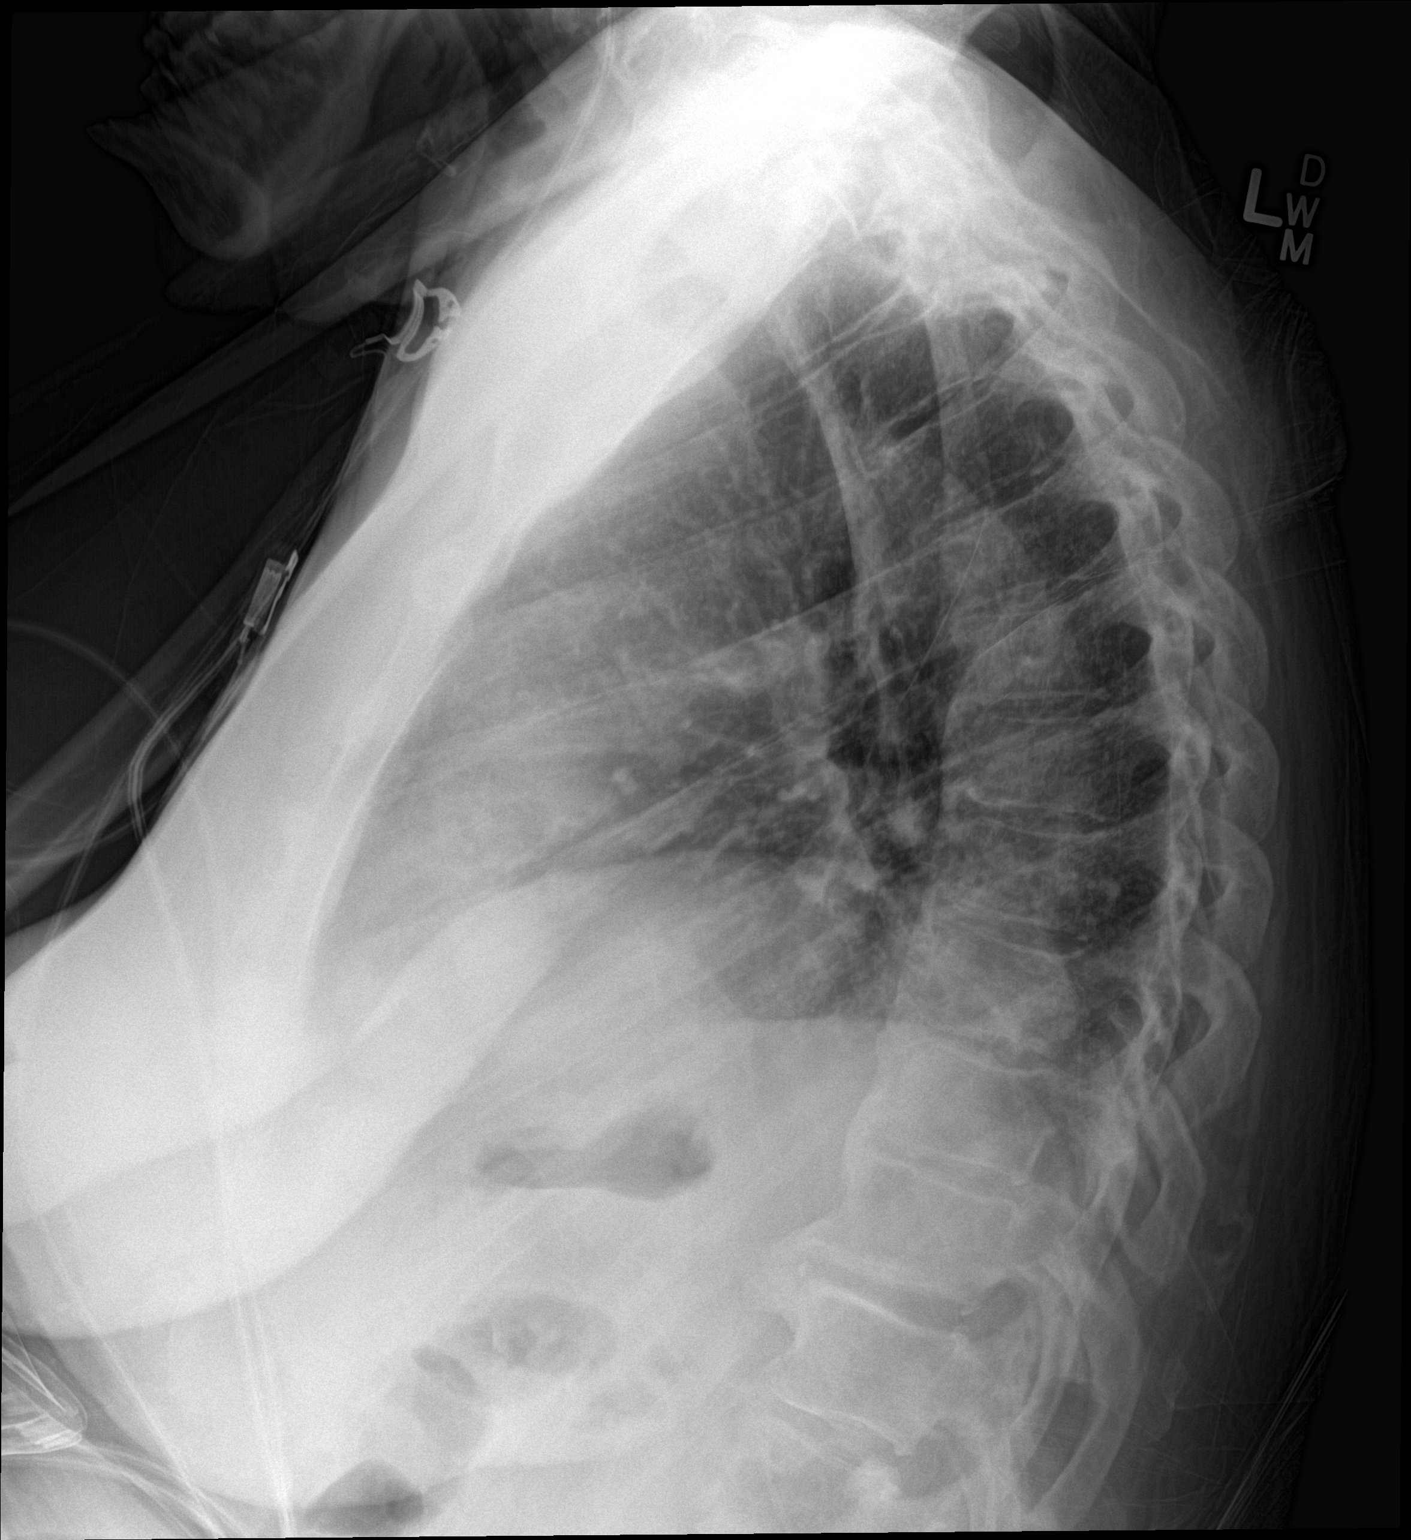

[chest ap]
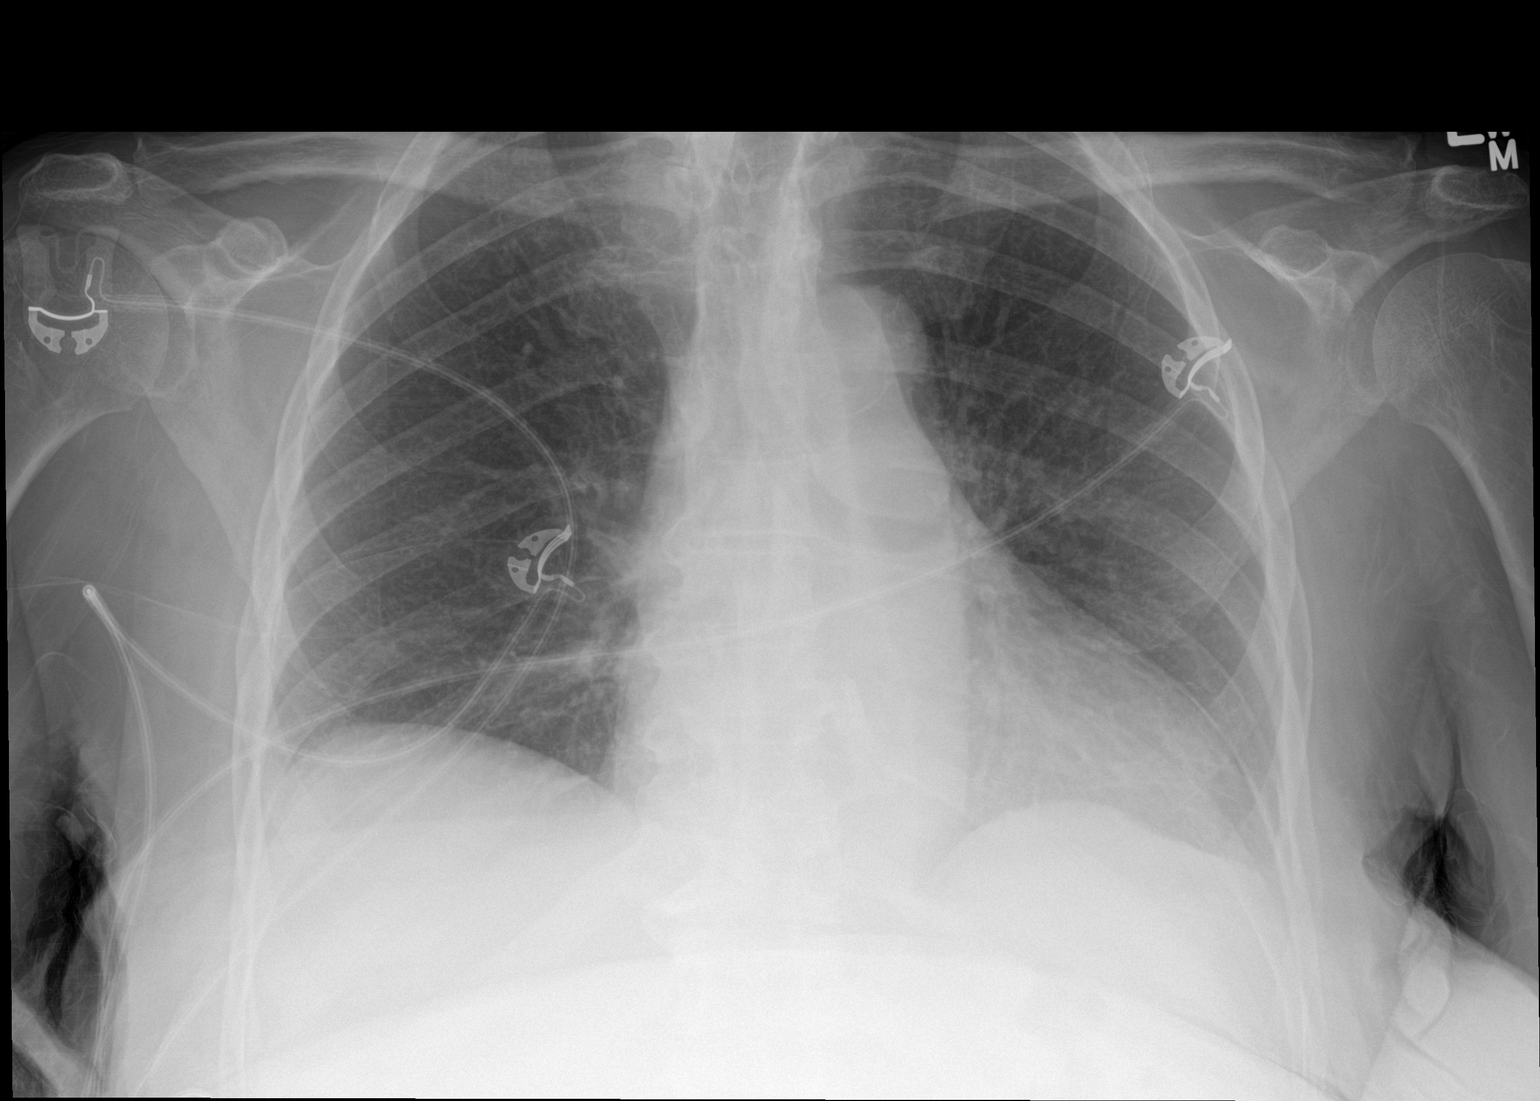

[2 of 2 positions shown; findings below may reference images not displayed]

FINDINGS: Lungs are clear. Heart and mediastinum are within normal limits. The
trachea is midline. No large pleural effusions. Degenerative changes
in the thoracic spine.
IMPRESSION: No active cardiopulmonary disease.

## 2018-07-28 IMAGING — DX DG CHEST 1V PORT
1 series · 1 of 1 positions shown · non-contrast
Comparison: 04/19/2015

CLINICAL DATA: Acute kidney injury

EXAM:
PORTABLE CHEST 1 VIEW

[chest ap]
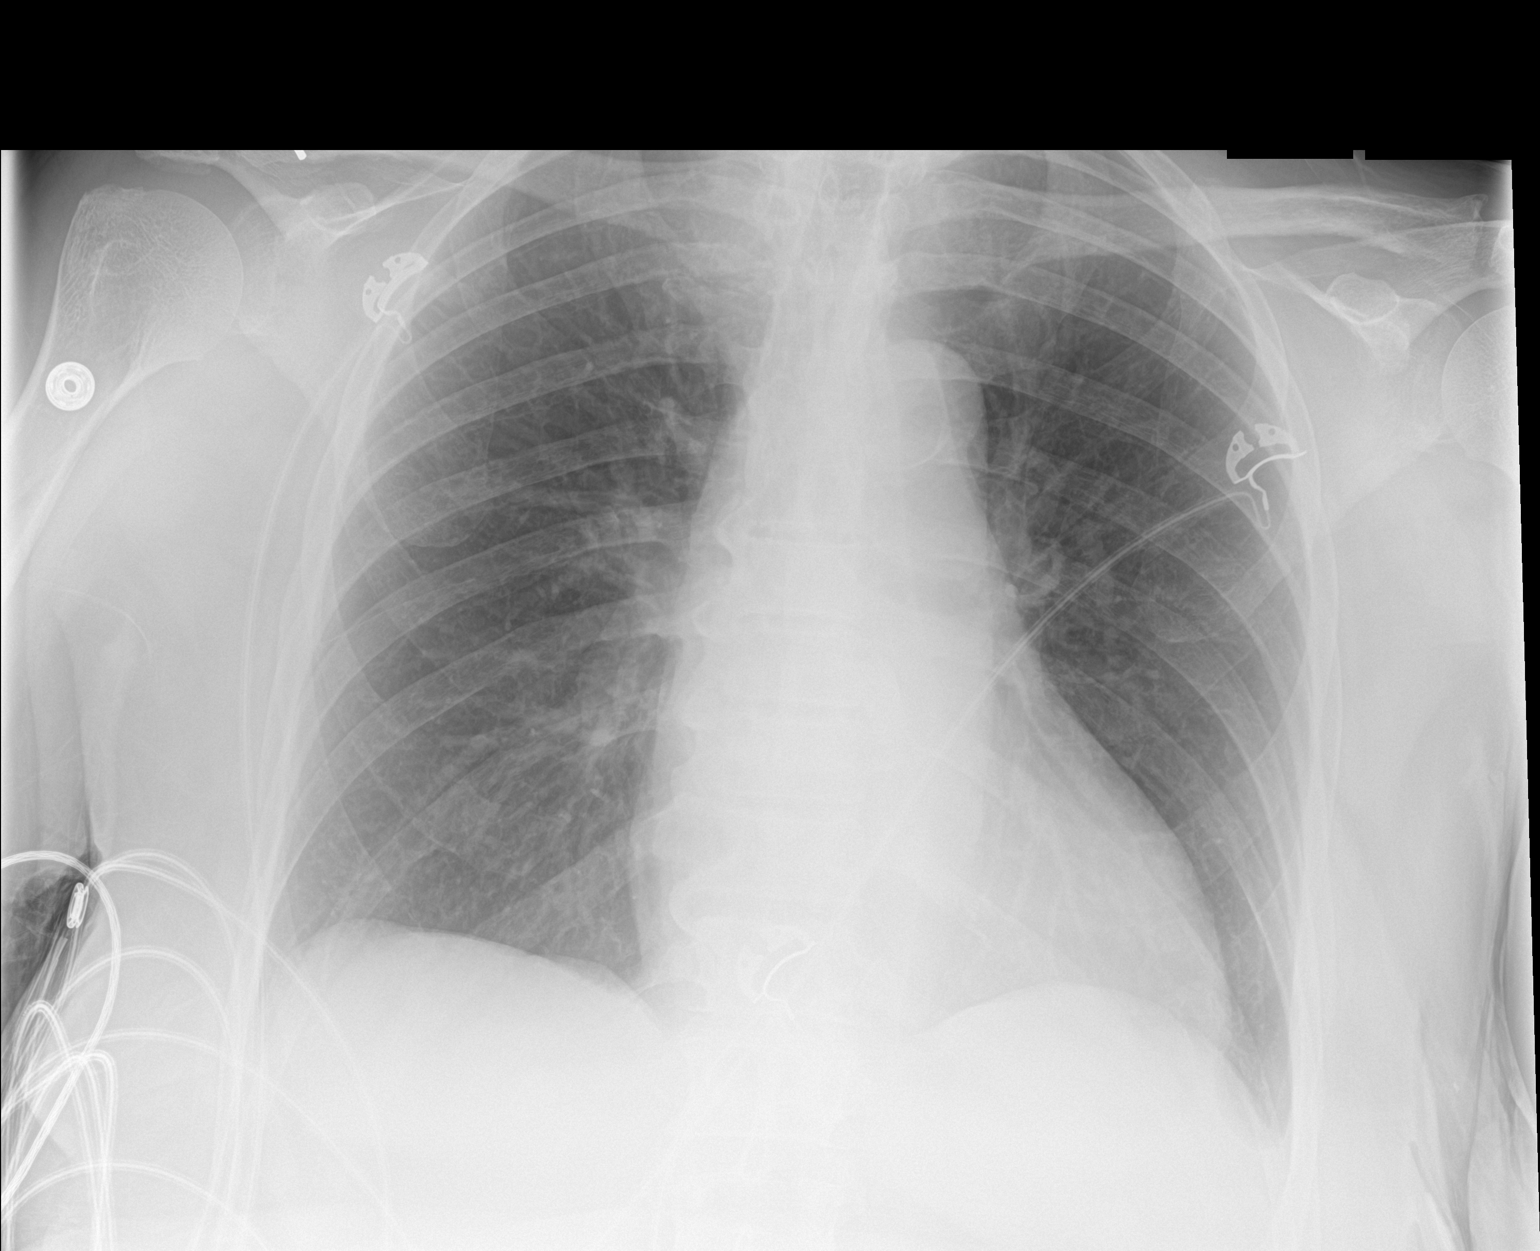

[1 of 1 positions shown; findings below may reference images not displayed]

FINDINGS: The heart size and mediastinal contours are within normal limits.
Both lungs are clear. The visualized skeletal structures are
unremarkable.
IMPRESSION: No active disease.

## 2018-10-09 IMAGING — US US RENAL
1 series · 14 of 25 positions shown · non-contrast
Comparison: CT abdomen pelvis 04/19/2015

CLINICAL DATA: Hypertension and liver disease

EXAM:
RENAL / URINARY TRACT ULTRASOUND COMPLETE

[Series 1: us renal · 0.20mm/px · 45 acquisitions, 14 frames shown]
[im 1/45]
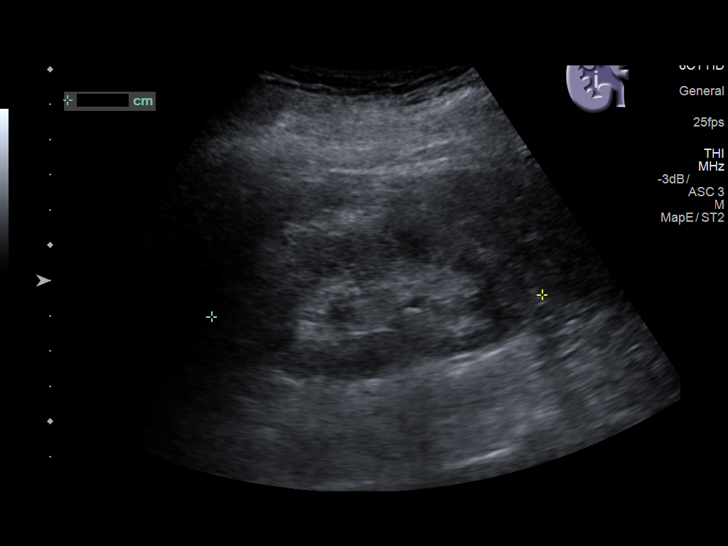
[im 4/45]
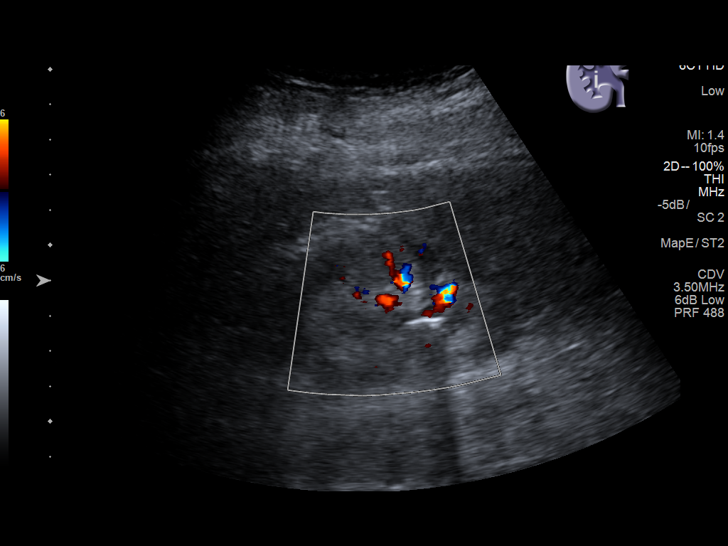
[im 8/45]
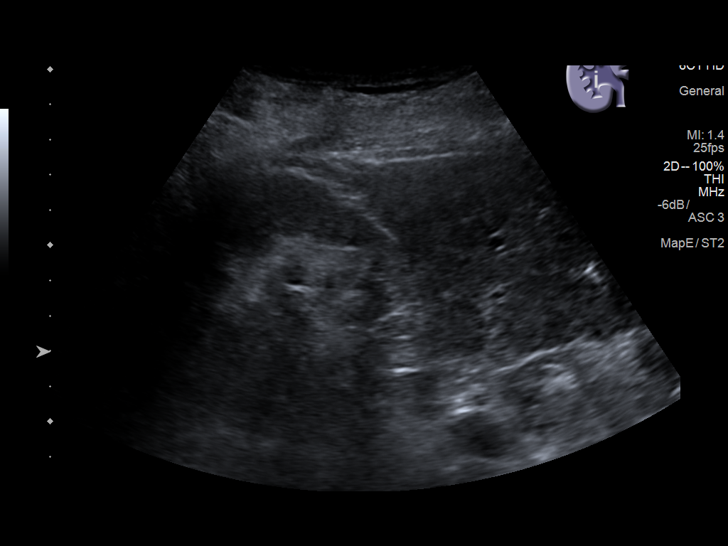
[im 12/45]
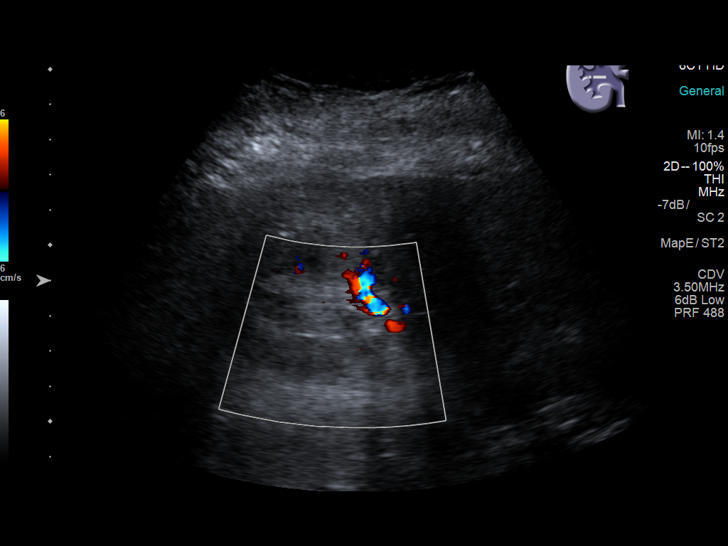
[im 15/45]
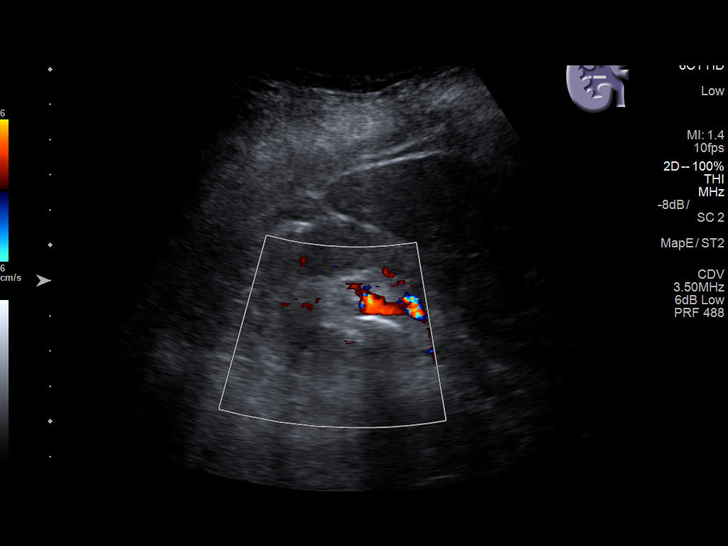
[im 17/45]
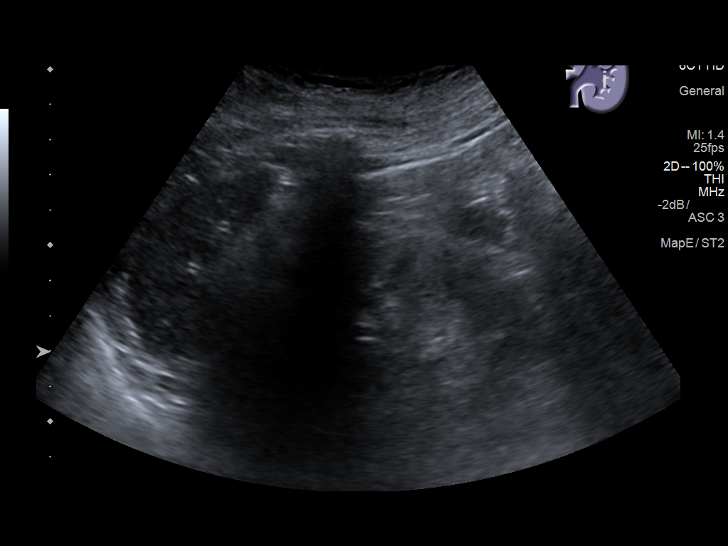
[im 21/45]
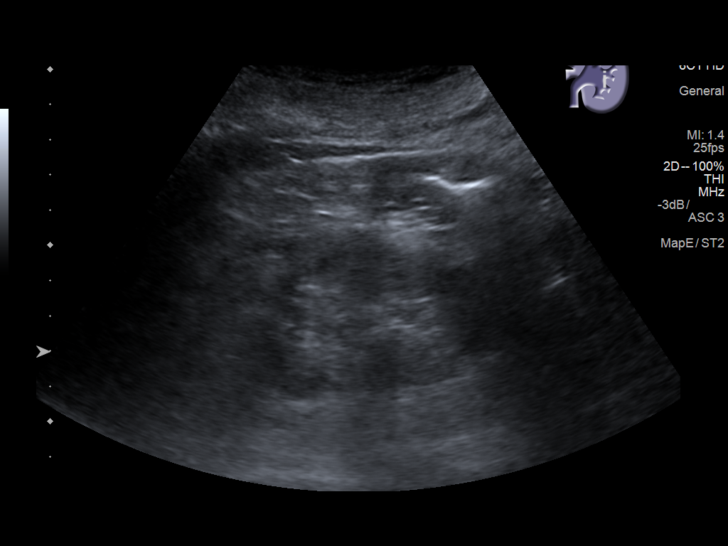
[im 24/45]
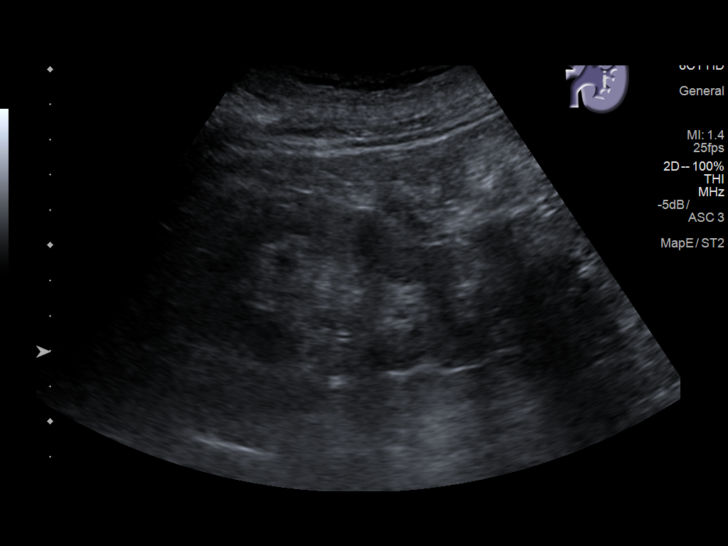
[im 28/45]
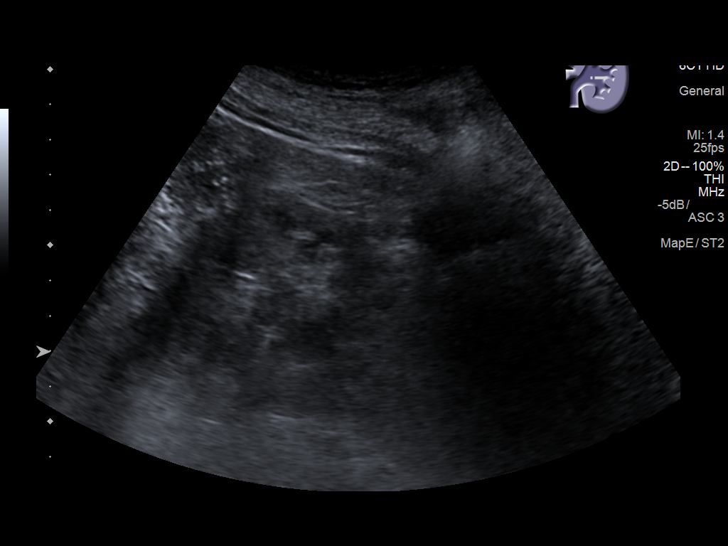
[im 30/45]
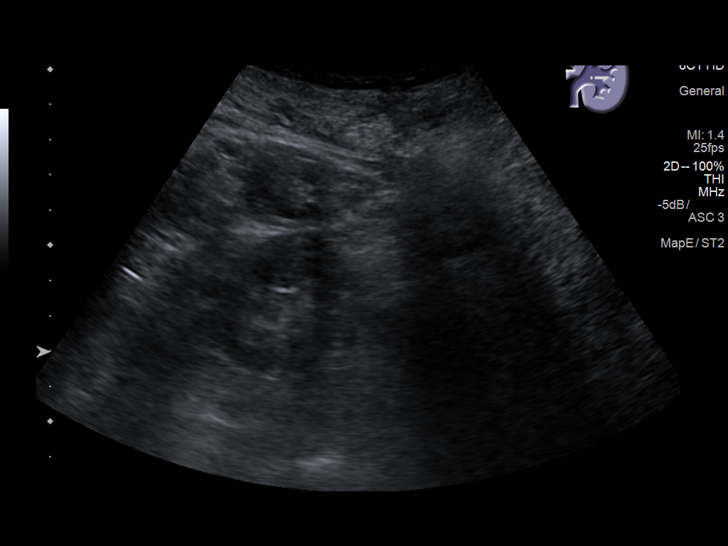
[im 34/45]
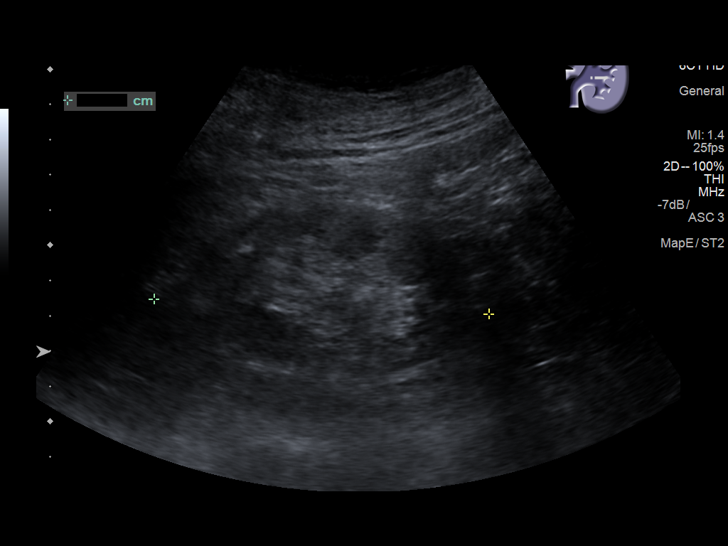
[im 37/45]
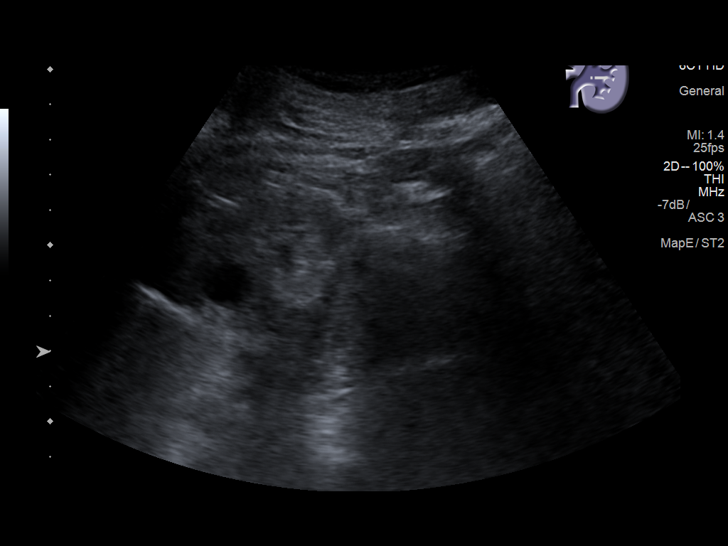
[im 41/45]
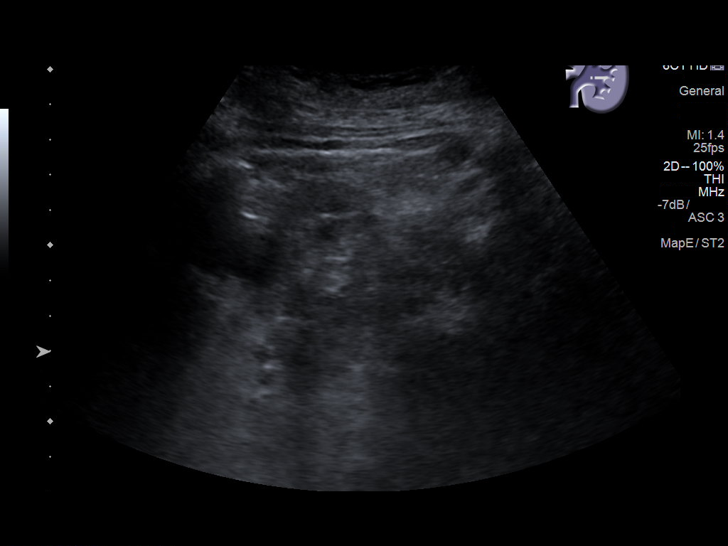
[im 45/45]
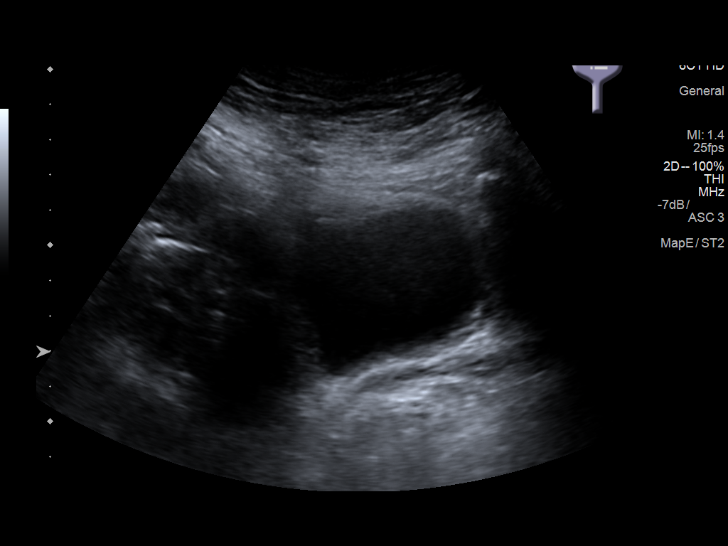

[14 of 25 positions shown; findings below may reference images not displayed]

FINDINGS: Right Kidney:

Length: 9.4 cm. There is increased echogenicity of the right kidney.
Near the lower pole, there is a shadowing calculus measuring 12 mm.
No hydronephrosis or solid renal mass.

Left Kidney:

Length: 9.5 cm. There is increased echogenicity of the left kidney.
There is a 7 mm nonobstructive renal calculus seen. There is a renal
cyst measuring 2.0 x 1.4 x 1.6 cm. No hydronephrosis.

Bladder:

Appears normal for degree of bladder distention.
IMPRESSION: 1. Increased echogenicity of both kidneys, suggesting chronic
medical renal disease.
2. Bilateral nonobstructing nephrolithiasis with largest stone
measuring 1.2 cm.

## 2020-06-29 DEATH — deceased
